# Patient Record
Sex: Female | Born: 1990 | Race: Asian | Hispanic: No | Marital: Married | Smoking: Never smoker
Health system: Southern US, Community
[De-identification: ages and names within clinical notes are randomized; demographics above are authoritative.]

## PROBLEM LIST (undated history)

## (undated) ENCOUNTER — Inpatient Hospital Stay (HOSPITAL_COMMUNITY): Payer: Self-pay

## (undated) DIAGNOSIS — O42919 Preterm premature rupture of membranes, unspecified as to length of time between rupture and onset of labor, unspecified trimester: Secondary | ICD-10-CM

## (undated) DIAGNOSIS — B999 Unspecified infectious disease: Secondary | ICD-10-CM

## (undated) DIAGNOSIS — D649 Anemia, unspecified: Secondary | ICD-10-CM

## (undated) HISTORY — DX: Preterm premature rupture of membranes, unspecified as to length of time between rupture and onset of labor, unspecified trimester: O42.919

## (undated) HISTORY — PX: NO PAST SURGERIES: SHX2092

---

## 2017-05-04 NOTE — L&D Delivery Note (Addendum)
Obstetrical Delivery Note   Date of Delivery:   08/31/2017 Primary OB:   [facility] Gestational Age/EDD: [redacted]w[redacted]d Reason for Admission: PPROM at 16/3, advanced cervical dilation Antepartum complications: Chronic vaginal bleeding  Delivered By:   Cornelia Copa. MD  Delivery Type:   Spontaneous vaginal delivery Delivery Details:   Patient with sudden onsent pain and pressure after cytotec vaginally placed approximately 4 hours prior. patient easily delivered fetus. Patient had temp to 100.3 right after delivery. Will give tylenol and watch clinically since nidus for infection has been delivered. A dose of methergine IM was given post delivery for PPH prophylaxis.  Anesthesia:    none Intrapartum complications: None GBS:    unknown Laceration:    none Episiotomy:    none Rectal exam:   deferred Placenta:    Delivered easily. Intact: yes. To pathology: yes.  3 vessel cord seen.  Estimated Blood Loss:  50mL  Baby:    Stillborn female, APGARs 0/0, weight 116gm. Appears morphologically normal.   Cornelia Copa MD Attending Center for [facility])

## 2017-06-08 ENCOUNTER — Encounter (HOSPITAL_COMMUNITY): Payer: Self-pay | Admitting: Advanced Practice Midwife

## 2017-06-08 ENCOUNTER — Inpatient Hospital Stay (HOSPITAL_COMMUNITY): Payer: Self-pay

## 2017-06-08 ENCOUNTER — Other Ambulatory Visit: Payer: Self-pay

## 2017-06-08 ENCOUNTER — Inpatient Hospital Stay (HOSPITAL_COMMUNITY)
Admission: AD | Admit: 2017-06-08 | Discharge: 2017-06-08 | Disposition: A | Discharge status: Home / Self Care | Payer: Self-pay | Source: Ambulatory Visit | Attending: Obstetrics and Gynecology | Admitting: Obstetrics and Gynecology

## 2017-06-08 DIAGNOSIS — O208 Other hemorrhage in early pregnancy: Secondary | ICD-10-CM

## 2017-06-08 DIAGNOSIS — O209 Hemorrhage in early pregnancy, unspecified: Secondary | ICD-10-CM | POA: Insufficient documentation

## 2017-06-08 DIAGNOSIS — Z3A01 Less than 8 weeks gestation of pregnancy: Secondary | ICD-10-CM | POA: Insufficient documentation

## 2017-06-08 DIAGNOSIS — O3680X0 Pregnancy with inconclusive fetal viability, not applicable or unspecified: Secondary | ICD-10-CM

## 2017-06-08 LAB — WET PREP, GENITAL
Clue Cells Wet Prep HPF POC: NONE SEEN
Sperm: NONE SEEN
TRICH WET PREP: NONE SEEN
YEAST WET PREP: NONE SEEN

## 2017-06-08 LAB — POCT PREGNANCY, URINE: PREG TEST UR: POSITIVE — AB

## 2017-06-08 LAB — URINALYSIS, ROUTINE W REFLEX MICROSCOPIC
BILIRUBIN URINE: NEGATIVE
Glucose, UA: NEGATIVE mg/dL
KETONES UR: NEGATIVE mg/dL
LEUKOCYTES UA: NEGATIVE
NITRITE: NEGATIVE
PROTEIN: NEGATIVE mg/dL
Specific Gravity, Urine: 1.004 — ABNORMAL LOW (ref 1.005–1.030)
pH: 6 (ref 5.0–8.0)

## 2017-06-08 LAB — CBC
HCT: 35.6 % — ABNORMAL LOW (ref 36.0–46.0)
Hemoglobin: 12 g/dL (ref 12.0–15.0)
MCH: 25.1 pg — ABNORMAL LOW (ref 26.0–34.0)
MCHC: 33.7 g/dL (ref 30.0–36.0)
MCV: 74.5 fL — ABNORMAL LOW (ref 78.0–100.0)
Platelets: 258 10*3/uL (ref 150–400)
RBC: 4.78 MIL/uL (ref 3.87–5.11)
RDW: 14 % (ref 11.5–15.5)
WBC: 9.3 10*3/uL (ref 4.0–10.5)

## 2017-06-08 LAB — HCG, QUANTITATIVE, PREGNANCY: HCG, BETA CHAIN, QUANT, S: 2416 m[IU]/mL — AB (ref <5)

## 2017-06-08 LAB — ABO/RH: ABO/RH(D): B POS

## 2017-06-08 MED ORDER — CONCEPT OB 130-92.4-1 MG PO CAPS: 1.0000 | ORAL_CAPSULE | Freq: Every day | ORAL | 12 refills | Status: AC

## 2017-06-08 NOTE — Discharge Instructions (Signed)
Vaginal Bleeding During Pregnancy, First Trimester °A small amount of bleeding (spotting) from the vagina is common in early pregnancy. Sometimes the bleeding is normal and is not a problem, and sometimes it is a sign of something serious. Be sure to tell your doctor about any bleeding from your vagina right away. °Follow these instructions at home: °· Watch your condition for any changes. °· Follow your doctor's instructions about how active you can be. °· If you are on bed rest: °? You may need to stay in bed and only get up to use the bathroom. °? You may be allowed to do some activities. °? If you need help, make plans for someone to help you. °· Write down: °? The number of pads you use each day. °? How often you change pads. °? How soaked (saturated) your pads are. °· Do not use tampons. °· Do not douche. °· Do not have sex or orgasms until your doctor says it is okay. °· If you pass any tissue from your vagina, save the tissue so you can show it to your doctor. °· Only take medicines as told by your doctor. °· Do not take aspirin because it can make you bleed. °· Keep all follow-up visits as told by your doctor. °Contact a doctor if: °· You bleed from your vagina. °· You have cramps. °· You have labor pains. °· You have a fever that does not go away after you take medicine. °Get help right away if: °· You have very bad cramps in your back or belly (abdomen). °· You pass large clots or tissue from your vagina. °· You bleed more. °· You feel light-headed or weak. °· You pass out (faint). °· You have chills. °· You are leaking fluid or have a gush of fluid from your vagina. °· You pass out while pooping (having a bowel movement). °This information is not intended to replace advice given to you by your health care provider. Make sure you discuss any questions you have with your health care provider. °Document Released: 09/04/2013 Document Revised: 09/26/2015 Document Reviewed: 12/26/2012 °Elsevier Interactive  Patient Education © 2018 Elsevier Inc. ° °

## 2017-06-08 NOTE — MAU Note (Cosign Needed Addendum)
History  Chief Complaint  Patient presents with  . Vaginal Bleeding  . Abdominal Pain  . Possible Pregnancy   HPI  Rita Acosta is a 27 y.o. G44P0000 female with non-contriubatory PMHx who presents with her husband (she wants him to help translate for her) to the MAU with a chief complaint of vaginal spotting and a positive home pregnancy test. Patient states that she has had longer intermenstrual time since moving here a few months ago ranging anywhere from 35 to 40 days between periods. She states that her and her husband had been trying to get pregnant recently, so when she felt "off" they decided to take a pregnancy test which came back positive. She wanted to be evaluated here today to have confirmation of this pregnancy and to be evaluated for the spotting. States that her LMP was 04/29/17 and was normal in duration, amount of blood and felt subjectively normal. Given this LMP date, estimated to be [redacted]w[redacted]d gestation.  Otherwise she denies any dizziness, syncope, headache, fevers, chills, palpitations, shortness of breath, cough, NVD, constipation. Endorses some lower crampy abdominal pain that is intermittent and 2-3/10 severity over the past week. Endorses dependent lower extremity edema only when "wearing certain shoes." Endorses a small amount (<teaspoon) of blood from her vagina over the past 2 days. Patient also notes some thin clear/white discharge that is "normal after all of my periods."   Patient has never had a pap smear. She does state that she has had a bimanual exam in the past.   History reviewed. No pertinent past medical history.  History reviewed. No pertinent surgical history.  No family history on file.  Social History   Tobacco Use  . Smoking status: Never Smoker  . Smokeless tobacco: Never Used  Substance Use Topics  . Alcohol use: No    Frequency: Never  . Drug use: No    Allergies: Allergies not on file  No medications prior to admission.    Physical  Exam Blood pressure 103/79, pulse 88, temperature 99.2 F (37.3 C), temperature source Oral, resp. rate 16, weight 53.3 kg (117 lb 8 oz), last menstrual period 04/29/2017, SpO2 100 %. Physical Exam  Constitutional: She is oriented to person, place, and time. She appears well-developed and well-nourished. No distress.  Eyes: Conjunctivae and EOM are normal. Pupils are equal, round, and reactive to light. Right eye exhibits no discharge. Left eye exhibits no discharge. No scleral icterus.  Cardiovascular: Normal rate, regular rhythm and intact distal pulses.  Murmur (2/6 systolic murmur appreciated) heard. Respiratory: Effort normal and breath sounds normal. No stridor. No respiratory distress. She has no wheezes. She has no rales. She exhibits no tenderness.  GI: Soft. Bowel sounds are normal. She exhibits no distension and no mass. There is no tenderness. There is no rebound and no guarding.  Genitourinary: Vagina normal and uterus normal. No vaginal discharge found.  Genitourinary Comments: Small amount of blood coming from the cervical os. No lesions. No ectropion.   Musculoskeletal: She exhibits no edema or deformity.  Neurological: She is alert and oriented to person, place, and time.  Skin: Skin is warm and dry. No rash noted. She is not diaphoretic. No erythema. No pallor.  Psychiatric: She has a normal mood and affect. Her behavior is normal. Judgment and thought content normal.     MDM Patient hCG >2400. TVUS revealed "A small rounded fluid collection in the endometrium is likely an early gestational sac. No yolk sac or fetal pole is seen  at this time however. A small amount of fluid in the endometrial canal may represent a small amount of blood given history." Cannot rule out miscarriage or extrauterine pregnancy.     Assessment/Plan  1. First Trimester Bleeding -patient to follow up in clinic on Friday for repeat hCG. Need to schedule TVUS one week after labs on Friday to  re-evaluate uterine contents.  -recommend no intercourse until labs on Friday in order to not cause undue scare if blood.  -patient to seek medical attention if severe abdominal pain, heavy bleeding, fevers, syncope, low blood pressures.   CNM attestation:  I have seen and examined this patient and agree with above documentation in the PA student's note.   Malena CatholicKhadiza Acosta is a 27 y.o. G1P0 female reporting pos home UPT and light vaginal bleeding.   Associated symptoms:  Neg fever and chills Neg abdominal pain Pos vaginal bleeding Neg vaginal discharge Neg urinary complaints Neg GI complaints  PE: No data found. Gen: calm comfortable, NAD Resp: normal effort, no distress Heart: Regular rate Abd: Soft, NT, Pos BS x 4 Neuro: A&O x 4 Pelvic exam: NEFG, Physiologic thin, white, odorless discharge, scant brown vaginal bleeding. Uterus top-nml size, no adnexal masses or tenderness. no cervical motion tenderness. Exam performed by CNM.  ROS, labs, PMH reviewed  Orders Placed This Encounter  Procedures  . Wet prep, genital  . US OB Transvaginal  . US OB Comp Less 14 Wks  . Urinalysis, Routine w reflex microscopic  . hCG, quantitative, pregnancy  . CBC  . Pregnancy, urine POC  . ABO/Rh  . Discharge patient   Meds ordered this encounter  Medications  . Prenat w/o A Vit-FeFum-FePo-FA (CONCEPT OB) 130-92.4-1 MG CAPS    Sig: Take 1 tablet by mouth daily.    Dispense:  30 capsule    Refill:  12    Order Specific Question:   Supervising Provider    Answer:   Levie HeritageSTINSON, JACOB J [[id number]]    MDM PUL. Hemodynamically stable.   Assessment 1. Pregnancy of unknown anatomic location   2. Vaginal bleeding affecting early pregnancy     Plan: - Discharge home in stable condition. - Ectopic precautions. - [facility]. - Return to maternity admissions symptoms worsen  Katrinka BlazingSmith, [place], [place] 06/27/2017 1:53 PM

## 2017-06-08 NOTE — MAU Note (Signed)
+  HPT this morning. Started spotting 2 days ago, reddish initially now red.  Cramping in lower abd. Headache off and on for past wk.

## 2017-06-09 LAB — GC/CHLAMYDIA PROBE AMP (~~LOC~~) NOT AT ARMC
Chlamydia: NEGATIVE
Neisseria Gonorrhea: NEGATIVE

## 2017-06-10 LAB — HIV ANTIBODY (ROUTINE TESTING W REFLEX): HIV Screen 4th Generation wRfx: NONREACTIVE

## 2017-06-11 ENCOUNTER — Ambulatory Visit: Payer: Self-pay | Admitting: *Deleted

## 2017-06-11 ENCOUNTER — Encounter: Payer: Self-pay | Admitting: *Deleted

## 2017-06-11 DIAGNOSIS — O200 Threatened abortion: Secondary | ICD-10-CM

## 2017-06-11 LAB — HCG, QUANTITATIVE, PREGNANCY: hCG, Beta Chain, Quant, S: 6960 m[IU]/mL — ABNORMAL HIGH (ref <5)

## 2017-06-11 NOTE — Progress Notes (Signed)
Patient here for STAT beta hCG.  Last seen in MAU on 06/08/17.  Beta hCG on 06/08/17 = 2416.  Ultrasound in MAU on 06/08/17 -  IMPRESSION: 1. A small rounded fluid collection in the endometrium is likely an early gestational sac. No yolk sac or fetal pole is seen at this time however. Recommend close clinical and imaging follow-up. 2. A small amount of fluid in the endometrial canal may represent a small amount of blood given history.  Patient speaks some [place].  No problem with discussing what we would be doing today.  Today patient states she has had no additional bleeding since her MAU visit on 06/08/17.  States she is experiencing some mild period type cramping.  Blood draw to be completed and run STAT.  Patient and significant other to wait in lobby for results.  Patient states understanding.  Beta hCG results final = 6,960.  Spoke via phone with Dr. Vergie LivingPickens covering house.  Read U/S results from 06/08/17 MAU visit.  Explained patient came into MAU for some bleeding and positive pregnancy test.  Patient has no significant medical history per MAU notes.  Dr. Vergie LivingPickens ordered repeat U/S in 12-14 days.  Ultrasound scheduled for 06/22/17 at 1245 pm.  Explained to patient and husband that after the U/S they will come back to our office for results.  Encouraged to continue her prenatal vitamins and folic acid.  Encouraged a healthy diet.  Husband asked for letter with estimated due date for [facility].  Letter given.  I encouraged them if they desire to have prenatal care here to tell [facility] staff and they will schedule prenatal appointment with our office for the patient.  I did explain we really won't know for sure that we have a good pregnancy until the U/S on 06/22/17 is completed.  Both patient and husband state understanding.  No other questions expressed.  AVS printed and given to patient.

## 2017-06-13 ENCOUNTER — Inpatient Hospital Stay (HOSPITAL_COMMUNITY): Payer: Self-pay

## 2017-06-13 ENCOUNTER — Inpatient Hospital Stay (HOSPITAL_COMMUNITY)
Admission: AD | Admit: 2017-06-13 | Discharge: 2017-06-13 | Disposition: A | Discharge status: Home / Self Care | Payer: Self-pay | Source: Ambulatory Visit | Attending: Obstetrics & Gynecology | Admitting: Obstetrics & Gynecology

## 2017-06-13 ENCOUNTER — Encounter (HOSPITAL_COMMUNITY): Payer: Self-pay

## 2017-06-13 DIAGNOSIS — O209 Hemorrhage in early pregnancy, unspecified: Secondary | ICD-10-CM | POA: Insufficient documentation

## 2017-06-13 DIAGNOSIS — Z3A01 Less than 8 weeks gestation of pregnancy: Secondary | ICD-10-CM | POA: Insufficient documentation

## 2017-06-13 DIAGNOSIS — Z6730 Type AB blood, Rh positive: Secondary | ICD-10-CM | POA: Insufficient documentation

## 2017-06-13 DIAGNOSIS — O4691 Antepartum hemorrhage, unspecified, first trimester: Secondary | ICD-10-CM

## 2017-06-13 DIAGNOSIS — Z6790 Unspecified blood type, Rh positive: Secondary | ICD-10-CM

## 2017-06-13 DIAGNOSIS — O4690 Antepartum hemorrhage, unspecified, unspecified trimester: Secondary | ICD-10-CM

## 2017-06-13 HISTORY — DX: Unspecified infectious disease: B99.9

## 2017-06-13 LAB — URINALYSIS, ROUTINE W REFLEX MICROSCOPIC
BILIRUBIN URINE: NEGATIVE
Glucose, UA: NEGATIVE mg/dL
Ketones, ur: NEGATIVE mg/dL
Leukocytes, UA: NEGATIVE
NITRITE: NEGATIVE
PROTEIN: NEGATIVE mg/dL
Specific Gravity, Urine: 1.006 (ref 1.005–1.030)
pH: 6 (ref 5.0–8.0)

## 2017-06-13 LAB — CBC
HCT: 36.2 % (ref 36.0–46.0)
Hemoglobin: 12.3 g/dL (ref 12.0–15.0)
MCH: 25.2 pg — ABNORMAL LOW (ref 26.0–34.0)
MCHC: 34 g/dL (ref 30.0–36.0)
MCV: 74.2 fL — ABNORMAL LOW (ref 78.0–100.0)
PLATELETS: 256 10*3/uL (ref 150–400)
RBC: 4.88 MIL/uL (ref 3.87–5.11)
RDW: 14.3 % (ref 11.5–15.5)
WBC: 9 10*3/uL (ref 4.0–10.5)

## 2017-06-13 LAB — HCG, QUANTITATIVE, PREGNANCY: HCG, BETA CHAIN, QUANT, S: 12857 m[IU]/mL — AB (ref <5)

## 2017-06-13 NOTE — MAU Note (Signed)
Pt c/o bright red vaginal bleeding that started Tuesday. Pt states it has continued and is now brownish discharge. Pt denies pain. Pt only using tissue for bleeding.

## 2017-06-13 NOTE — MAU Provider Note (Signed)
History     CSN: [id number]  Arrival date and time: 06/13/17 4098   First Provider Initiated Contact with Patient 06/13/17 2040      Chief Complaint  Patient presents with  . Vaginal Bleeding   27 y.o. G1 @[redacted]w[redacted]d  here with VB. Bleeding started 5 days ago. Today it was bright red when she used the BR, then became scant and brown through the day. It was not enough to need a pad. Also reports mild low abdominal cramping. She was seen in MAU 5 days ago for the bleeding and quant HCG was 2416, US showed small fluid collection but no YS or FP.     OB History    Gravida Para Term Preterm AB Living   1             SAB TAB Ectopic Multiple Live Births                  Past Medical History:  Diagnosis Date  . Infection    pneumonia as infant and malaria infection at 1 years old    Past Surgical History:  Procedure Laterality Date  . NO PAST SURGERIES      Family History  Problem Relation Age of Onset  . Hypertension Mother   . Diabetes Father     Social History   Tobacco Use  . Smoking status: Never Smoker  . Smokeless tobacco: Never Used  Substance Use Topics  . Alcohol use: No    Frequency: Never  . Drug use: No    Allergies: No Known Allergies  No medications prior to admission.    Review of Systems  Gastrointestinal: Positive for abdominal pain.  Genitourinary: Positive for vaginal bleeding.   Physical Exam   Blood pressure (!) 103/55, pulse 77, temperature 98.1 F (36.7 C), temperature source Oral, resp. rate 16, height 5' (1.524 m), weight 120 lb (54.4 kg), last menstrual period 04/29/2017, SpO2 100 %.  Physical Exam  Constitutional: She is oriented to person, place, and time. She appears well-developed and well-nourished. No distress.  HENT:  Head: Normocephalic.  Neck: Normal range of motion.  Cardiovascular: Normal rate.  Respiratory: Effort normal. No respiratory distress.  Genitourinary:  Genitourinary Comments: External: no lesions or  erythema Vagina: rugated, pink, moist, scant drk bloody discharge, cleared with 1 fox swab   Musculoskeletal: Normal range of motion.  Neurological: She is alert and oriented to person, place, and time.  Skin: Skin is warm and dry.  Psychiatric: She has a normal mood and affect.   Results for orders placed or performed during the hospital encounter of 06/13/17 (from the past 24 hour(s))  Urinalysis, Routine w reflex microscopic     Status: Abnormal   Collection Time: 06/13/17  8:08 PM  Result Value Ref Range   Color, Urine STRAW (A) YELLOW   APPearance CLEAR CLEAR   Specific Gravity, Urine 1.006 1.005 - 1.030   pH 6.0 5.0 - 8.0   Glucose, UA NEGATIVE NEGATIVE mg/dL   Hgb urine dipstick MODERATE (A) NEGATIVE   Bilirubin Urine NEGATIVE NEGATIVE   Ketones, ur NEGATIVE NEGATIVE mg/dL   Protein, ur NEGATIVE NEGATIVE mg/dL   Nitrite NEGATIVE NEGATIVE   Leukocytes, UA NEGATIVE NEGATIVE   RBC / HPF 0-5 0 - 5 RBC/hpf   WBC, UA 0-5 0 - 5 WBC/hpf   Bacteria, UA RARE (A) NONE SEEN   Squamous Epithelial / LPF 0-5 (A) NONE SEEN  CBC     Status: Abnormal  Collection Time: 06/13/17  9:02 PM  Result Value Ref Range   WBC 9.0 4.0 - 10.5 K/uL   RBC 4.88 3.87 - 5.11 MIL/uL   Hemoglobin 12.3 12.0 - 15.0 g/dL   HCT 16.136.2 09.636.0 - 04.546.0 %   MCV 74.2 (L) 78.0 - 100.0 fL   MCH 25.2 (L) 26.0 - 34.0 pg   MCHC 34.0 30.0 - 36.0 g/dL   RDW 40.914.3 81.111.5 - 91.415.5 %   Platelets 256 150 - 400 K/uL  hCG, quantitative, pregnancy     Status: Abnormal   Collection Time: 06/13/17  9:02 PM  Result Value Ref Range   hCG, Beta Chain, Quant, S 12,857 (H) <5 mIU/mL   [place] Ob Transvaginal  Result Date: 06/13/2017 CLINICAL DATA:  Acute onset of vaginal bleeding. EXAM: TRANSVAGINAL OB ULTRASOUND TECHNIQUE: Transvaginal ultrasound was performed for complete evaluation of the gestation as well as the maternal uterus, adnexal regions, and pelvic cul-de-sac. COMPARISON:  Pelvic ultrasound performed 06/08/2017 FINDINGS:  Intrauterine gestational sac: Single; visualized and normal in shape. Yolk sac:  Yes Embryo:  No MSD: 10.2 mm   5 w   5  d Subchorionic hemorrhage: None seen. A small amount of clot is noted at the lower uterine segment. Maternal uterus/adnexae: The ovaries are unremarkable in appearance. The right ovary measures 3.2 x 2.7 x 2.1 cm, while the left ovary measures 2.5 x 1.2 x 1.8 cm. There is no evidence for ovarian torsion. No suspicious adnexal masses are seen. Trace free fluid is seen within the pelvic cul-de-sac. IMPRESSION: 1. Single intrauterine gestational sac noted, with a mean sac diameter of 1.0 cm, corresponding to a gestational age of [redacted] weeks 5 days. This matches the gestational age of [redacted] weeks 3 days by LMP, reflecting an estimated date of delivery of February 03, 2018. No embryo is yet seen; a yolk sac is visualized. 2. Small amount of clot noted at the lower uterine segment, likely corresponding to the patient's vaginal bleeding. Electronically Signed   By: Roanna RaiderJeffery  Chang M.D.   On: 06/13/2017 21:42   MAU Course  Procedures  MDM Labs and US ordered and reviewed. Good rise in quant HCG. US shows IUGS and YS therefore ectopic ruled out. Recommend viability US as already scheduled. Discussed findings with pt and spouse. Stable for discharge home.  Assessment and Plan   1. [redacted] weeks gestation of pregnancy   2. Vaginal bleeding in pregnancy   3. Blood type, Rh positive    Discharge home SAB precautions Follow up in 9 days for US  Allergies as of 06/13/2017   No Known Allergies     Medication List    TAKE these medications   CONCEPT OB 130-92.4-1 MG Caps Take 1 tablet by mouth daily.   FOLIC ACID PO Take 1 tablet by mouth daily.      Donette LarryMelanie Burnadette Baskett, CNM 06/13/2017, 10:45 PM

## 2017-06-13 NOTE — Progress Notes (Addendum)
G1 @ 6.[redacted] wksga. Here for bleeding since Tuesday. Was here Friday at the clinic for repeat HCG level and had gone up from 2K to 5 k.  Pt cont to bleed small amounts when using the bathroom "teaspoon size".   2041: Provider at bs assessing. Speculum exam done to look at bleeding. Small amount of dark color bleeding noted.   2055: U/S paged and returned paged. Report status given. Ok to bring pt. Informed pt needs lab first and would bring her once completed.   2212: provider at bs reassessing and discussing results of u/s and tests.  2215:  d/c instruction given with pt understanding. Pt left unit via ambulatory with SO

## 2017-06-13 NOTE — Discharge Instructions (Signed)
Vaginal Bleeding During Pregnancy, First Trimester °A small amount of bleeding (spotting) from the vagina is common in early pregnancy. Sometimes the bleeding is normal and is not a problem, and sometimes it is a sign of something serious. Be sure to tell your doctor about any bleeding from your vagina right away. °Follow these instructions at home: °· Watch your condition for any changes. °· Follow your doctor's instructions about how active you can be. °· If you are on bed rest: °? You may need to stay in bed and only get up to use the bathroom. °? You may be allowed to do some activities. °? If you need help, make plans for someone to help you. °· Write down: °? The number of pads you use each day. °? How often you change pads. °? How soaked (saturated) your pads are. °· Do not use tampons. °· Do not douche. °· Do not have sex or orgasms until your doctor says it is okay. °· If you pass any tissue from your vagina, save the tissue so you can show it to your doctor. °· Only take medicines as told by your doctor. °· Do not take aspirin because it can make you bleed. °· Keep all follow-up visits as told by your doctor. °Contact a doctor if: °· You bleed from your vagina. °· You have cramps. °· You have labor pains. °· You have a fever that does not go away after you take medicine. °Get help right away if: °· You have very bad cramps in your back or belly (abdomen). °· You pass large clots or tissue from your vagina. °· You bleed more. °· You feel light-headed or weak. °· You pass out (faint). °· You have chills. °· You are leaking fluid or have a gush of fluid from your vagina. °· You pass out while pooping (having a bowel movement). °This information is not intended to replace advice given to you by your health care provider. Make sure you discuss any questions you have with your health care provider. °Document Released: 09/04/2013 Document Revised: 09/26/2015 Document Reviewed: 12/26/2012 °Elsevier Interactive  Patient Education © 2018 Elsevier Inc. ° °

## 2017-06-22 ENCOUNTER — Other Ambulatory Visit (INDEPENDENT_AMBULATORY_CARE_PROVIDER_SITE_OTHER): Payer: Self-pay

## 2017-06-22 ENCOUNTER — Encounter: Payer: Self-pay | Admitting: Student in an Organized Health Care Education/Training Program

## 2017-06-22 ENCOUNTER — Ambulatory Visit (HOSPITAL_COMMUNITY): Payer: Self-pay

## 2017-06-22 DIAGNOSIS — Z3A01 Less than 8 weeks gestation of pregnancy: Secondary | ICD-10-CM

## 2017-06-22 DIAGNOSIS — Z3491 Encounter for supervision of normal pregnancy, unspecified, first trimester: Secondary | ICD-10-CM

## 2017-06-22 LAB — POCT URINALYSIS DIP (MANUAL ENTRY)
BILIRUBIN UA: NEGATIVE
BILIRUBIN UA: NEGATIVE mg/dL
GLUCOSE UA: NEGATIVE mg/dL
Leukocytes, UA: NEGATIVE
Nitrite, UA: NEGATIVE
PH UA: 6 (ref 5.0–8.0)
Protein Ur, POC: NEGATIVE mg/dL
Urobilinogen, UA: 0.2 E.U./dL

## 2017-06-22 LAB — POCT UA - MICROSCOPIC ONLY

## 2017-06-23 LAB — OBSTETRIC PANEL, INCLUDING HIV
Antibody Screen: NEGATIVE
BASOS ABS: 0 10*3/uL (ref 0.0–0.2)
Basos: 0 %
EOS (ABSOLUTE): 0.1 10*3/uL (ref 0.0–0.4)
Eos: 1 %
HEP B S AG: NEGATIVE
HIV Screen 4th Generation wRfx: NONREACTIVE
Hematocrit: 37 % (ref 34.0–46.6)
Hemoglobin: 12.2 g/dL (ref 11.1–15.9)
IMMATURE GRANULOCYTES: 0 %
Immature Grans (Abs): 0 10*3/uL (ref 0.0–0.1)
Lymphocytes Absolute: 2.5 10*3/uL (ref 0.7–3.1)
Lymphs: 24 %
MCH: 25.2 pg — AB (ref 26.6–33.0)
MCHC: 33 g/dL (ref 31.5–35.7)
MCV: 76 fL — ABNORMAL LOW (ref 79–97)
Monocytes Absolute: 0.6 10*3/uL (ref 0.1–0.9)
Monocytes: 6 %
NEUTROS PCT: 69 %
Neutrophils Absolute: 7.2 10*3/uL — ABNORMAL HIGH (ref 1.4–7.0)
PLATELETS: 303 10*3/uL (ref 150–379)
RBC: 4.84 x10E6/uL (ref 3.77–5.28)
RDW: 14.8 % (ref 12.3–15.4)
RPR: NONREACTIVE
Rh Factor: POSITIVE
Rubella Antibodies, IGG: 4.43 index (ref >0.99)
WBC: 10.3 10*3/uL (ref 3.4–10.8)

## 2017-06-24 ENCOUNTER — Encounter: Payer: Self-pay | Admitting: Student in an Organized Health Care Education/Training Program

## 2017-06-24 LAB — URINE CULTURE, OB REFLEX

## 2017-06-24 LAB — CULTURE, OB URINE

## 2017-06-28 ENCOUNTER — Ambulatory Visit (HOSPITAL_COMMUNITY)
Admission: RE | Admit: 2017-06-28 | Discharge: 2017-06-28 | Disposition: A | Discharge status: Home / Self Care | Payer: Self-pay | Source: Ambulatory Visit | Attending: Family Medicine | Admitting: Family Medicine

## 2017-06-28 DIAGNOSIS — O3680X0 Pregnancy with inconclusive fetal viability, not applicable or unspecified: Secondary | ICD-10-CM | POA: Insufficient documentation

## 2017-06-28 DIAGNOSIS — Z3A01 Less than 8 weeks gestation of pregnancy: Secondary | ICD-10-CM | POA: Insufficient documentation

## 2017-06-29 ENCOUNTER — Ambulatory Visit (INDEPENDENT_AMBULATORY_CARE_PROVIDER_SITE_OTHER): Payer: Self-pay | Admitting: Student in an Organized Health Care Education/Training Program

## 2017-06-29 ENCOUNTER — Encounter: Payer: Self-pay | Admitting: Student in an Organized Health Care Education/Training Program

## 2017-06-29 ENCOUNTER — Other Ambulatory Visit: Payer: Self-pay

## 2017-06-29 VITALS — BP 98/64 | HR 86 | Temp 98.4°F | Wt 117.8 lb

## 2017-06-29 DIAGNOSIS — O219 Vomiting of pregnancy, unspecified: Secondary | ICD-10-CM

## 2017-06-29 DIAGNOSIS — Z3A08 8 weeks gestation of pregnancy: Secondary | ICD-10-CM

## 2017-06-29 MED ORDER — VITAMIN B-6 25 MG PO TABS: 25.0000 mg | ORAL_TABLET | Freq: Every day | ORAL | 0 refills | Status: DC

## 2017-06-29 NOTE — Progress Notes (Signed)
Rita Acosta is a 27 y.o. yo G1P0 at 8857w0d who presents for her initial prenatal visit. Pregnancy is planned She reports fatigue and morning sickness. She  is taking PNV. See flow sheet for details.  PMH, POBH, FH, meds, allergies and Social Hx reviewed.  Prenatal Exam: Gen: Well nourished, well developed.  No distress.  Vitals noted. HEENT: Normocephalic, atraumatic.  Neck supple without cervical lymphadenopathy, thyromegaly or thyroid nodules.  Fair dentition. CV: RRR no murmur, gallops or rubs Lungs: CTAB.  Normal respiratory effort without wheezes or rales. Abd: soft, NTND. +BS.  Uterus not appreciated above pelvis. GU: Normal external female genitalia without lesions.  Normal vaginal, well rugated without lesions. No vaginal discharge.  Bimanual exam: No adnexal mass or TTP. No CMT.  Uterus size below umbilicus Ext: No clubbing, cyanosis or edema. Psych: Normal grooming and dress.  Not depressed or anxious appearing.  Normal thought content and process without flight of ideas or looseness of associations.  Assessment & Plan: 1) 27 y.o. yo G1P0 at 4557w0d via early ultrasound, doing well.  Current pregnancy issues include:  1. Patient had early bleeding and was seen in the MAU. IUP confirmed on ultrasound. No bleeding since her ultrasound.   2. Nausea without vomiting: reviewed small snacks and hydration today. Patient would like rx for pyridoxine which was sent to her pharmacy.  Dating is reliable. Prenatal labs reviewed, normal. Genetic screening offered: Patient will think about it but she is leaning towards genetic testing. Early glucola is not ndicated.  PHQ-9 and Pregnancy Medical Home forms completed and reviewed.  Bleeding and pain precautions reviewed. Importance of prenatal vitamins reviewed.  Follow up in 4 weeks.

## 2017-06-29 NOTE — Patient Instructions (Signed)
It was a pleasure seeing you today in our clinic. Today we discussed your pregnancy.   - I prescribed Isoniazid (vitamin B6) to use for nausea. - Continue to take a prenatal vitamin. This is also available over the counter, prenatal vitamin + DHEA. - You can use any lotion, such as cetaphil, for your dry skin.  Please schedule follow up in 4-6 weeks.  Our clinic's number is [phone]. Please call with questions or concerns about what we discussed today.  Be well, Dr. Mosetta PuttFeng

## 2017-06-30 LAB — CERVICOVAGINAL ANCILLARY ONLY
CHLAMYDIA, DNA PROBE: NEGATIVE
Neisseria Gonorrhea: NEGATIVE

## 2017-07-01 LAB — SICKLE CELL SCREEN: Sickle Cell Screen: NEGATIVE

## 2017-07-01 LAB — CYTOLOGY - PAP: DIAGNOSIS: NEGATIVE

## 2017-07-05 ENCOUNTER — Encounter: Payer: Self-pay | Admitting: Student in an Organized Health Care Education/Training Program

## 2017-07-16 ENCOUNTER — Inpatient Hospital Stay (HOSPITAL_COMMUNITY)
Admission: AD | Admit: 2017-07-16 | Discharge: 2017-07-16 | Disposition: A | Discharge status: Home / Self Care | Payer: Self-pay | Source: Ambulatory Visit | Attending: Obstetrics & Gynecology | Admitting: Obstetrics & Gynecology

## 2017-07-16 ENCOUNTER — Encounter: Payer: Self-pay | Admitting: Student in an Organized Health Care Education/Training Program

## 2017-07-16 ENCOUNTER — Encounter (HOSPITAL_COMMUNITY): Payer: Self-pay

## 2017-07-16 DIAGNOSIS — O200 Threatened abortion: Secondary | ICD-10-CM | POA: Insufficient documentation

## 2017-07-16 DIAGNOSIS — Z3A09 9 weeks gestation of pregnancy: Secondary | ICD-10-CM | POA: Insufficient documentation

## 2017-07-16 DIAGNOSIS — Z3491 Encounter for supervision of normal pregnancy, unspecified, first trimester: Secondary | ICD-10-CM

## 2017-07-16 NOTE — Telephone Encounter (Signed)
Called patient again and LVM. Unable to give specifics over voicemail. Based on [employer] image, appears patient may be having a miscarriage. I would prefer to speak with her on the phone however I have been unable to get through.   I advised her to call back our clinic and indicated on the voice mail that may be appropriate for her to be evaluated in the MAU.  Additionally in the future we will have to let the patient know that mychart is not a good method of communication for potential medical emergencies.

## 2017-07-16 NOTE — Discharge Instructions (Signed)
Threatened Miscarriage °A threatened miscarriage occurs when you have vaginal bleeding during your first 20 weeks of pregnancy but the pregnancy has not ended. If you have vaginal bleeding during this time, your health care provider will do tests to make sure you are still pregnant. If the tests show you are still pregnant and the developing baby (fetus) inside your womb (uterus) is still growing, your condition is considered a threatened miscarriage. °A threatened miscarriage does not mean your pregnancy will end, but it does increase the risk of losing your pregnancy (complete miscarriage). °What are the causes? °The cause of a threatened miscarriage is usually not known. If you go on to have a complete miscarriage, the most common cause is an abnormal number of chromosomes in the developing baby. Chromosomes are the structures inside cells that hold all your genetic material. °Some causes of vaginal bleeding that do not result in miscarriage include: °· Having sex. °· Having an infection. °· Normal hormone changes of pregnancy. °· Bleeding that occurs when an egg implants in your uterus. ° °What increases the risk? °Risk factors for bleeding in early pregnancy include: °· Obesity. °· Smoking. °· Drinking excessive amounts of alcohol or caffeine. °· Recreational drug use. ° °What are the signs or symptoms? °· Light vaginal bleeding. °· Mild abdominal pain or cramps. °How is this diagnosed? °If you have bleeding with or without abdominal pain before 20 weeks of pregnancy, your health care provider will do tests to check whether you are still pregnant. One important test involves using sound waves and a computer (ultrasound) to create images of the inside of your uterus. Other tests include an internal exam of your vagina and uterus (pelvic exam) and measurement of your baby’s heart rate. °You may be diagnosed with a threatened miscarriage if: °· Ultrasound testing shows you are still pregnant. °· Your baby’s heart  rate is strong. °· A pelvic exam shows that the opening between your uterus and your vagina (cervix) is closed. °· Your heart rate and blood pressure are stable. °· Blood tests confirm you are still pregnant. ° °How is this treated? °No treatments have been shown to prevent a threatened miscarriage from going on to a complete miscarriage. However, the right home care is important. °Follow these instructions at home: °· Make sure you keep all your appointments for prenatal care. This is very important. °· Get plenty of rest. °· Do not have sex or use tampons if you have vaginal bleeding. °· Do not douche. °· Do not smoke or use recreational drugs. °· Do not drink alcohol. °· Avoid caffeine. °Contact a health care provider if: °· You have light vaginal bleeding or spotting while pregnant. °· You have abdominal pain or cramping. °· You have a fever. °Get help right away if: °· You have heavy vaginal bleeding. °· You have blood clots coming from your vagina. °· You have severe low back pain or abdominal cramps. °· You have fever, chills, and severe abdominal pain. °This information is not intended to replace advice given to you by your health care provider. Make sure you discuss any questions you have with your health care provider. °Document Released: 04/20/2005 Document Revised: 09/26/2015 Document Reviewed: 02/14/2013 °Elsevier Interactive Patient Education © 2018 Elsevier Inc[employer] ° °

## 2017-07-16 NOTE — MAU Provider Note (Signed)
History     CSN: [id number]  Arrival date and time: 07/16/17 1630   First Provider Initiated Contact with Patient 07/16/17 1758      Chief Complaint  Patient presents with  . Threatened Miscarriage   HPI Rita Acosta is a 27 y.o. G1P0 at 2978w5d who presents with vaginal bleeding. Reports some pink spotting yesterday and today passed a mass of gray tissue. Sent a picture to her MD & was told it could be a miscarriage. States bleeding has not continued. Denies abdominal pain. Denies n/v/d, fever/chills, dysuria, vaginal discharge, or recent intercourse. [facility]   OB History    Gravida Para Term Preterm AB Living   1             SAB TAB Ectopic Multiple Live Births                  Past Medical History:  Diagnosis Date  . Infection    pneumonia as infant and malaria infection at 27 years old    Past Surgical History:  Procedure Laterality Date  . NO PAST SURGERIES      Family History  Problem Relation Age of Onset  . Hypertension Mother   . Diabetes Father     Social History   Tobacco Use  . Smoking status: Never Smoker  . Smokeless tobacco: Never Used  Substance Use Topics  . Alcohol use: No    Frequency: Never  . Drug use: No    Allergies: No Known Allergies  Medications Prior to Admission  Medication Sig Dispense Refill Last Dose  . FOLIC ACID PO Take 1 tablet by mouth daily.   06/08/2017 at Unknown time  . Prenat w/o A Vit-FeFum-FePo-FA (CONCEPT OB) 130-92.4-1 MG CAPS Take 1 tablet by mouth daily. 30 capsule 12   . vitamin B-6 (PYRIDOXINE) 25 MG tablet Take 1 tablet (25 mg total) by mouth daily. 30 tablet 0     Review of Systems  Constitutional: Negative.   Gastrointestinal: Negative.   Genitourinary: Positive for vaginal bleeding.   Physical Exam   Blood pressure 95/67, pulse 82, temperature 98.6 F (37 C), temperature source Oral, resp. rate 16, height 4\' 11"  (1.499 m), weight 114 lb (51.7 kg), last  menstrual period 04/29/2017, SpO2 100 %.  Physical Exam  Nursing note and vitals reviewed. Constitutional: She is oriented to person, place, and time. She appears well-developed and well-nourished. No distress.  HENT:  Head: Normocephalic and atraumatic.  Eyes: Conjunctivae are normal. Right eye exhibits no discharge. Left eye exhibits no discharge. No scleral icterus.  Neck: Normal range of motion.  Respiratory: Effort normal. No respiratory distress.  GI: Soft. There is no tenderness.  Genitourinary: No bleeding in the vagina.  Genitourinary Comments: Cervix closed/thick  Neurological: She is alert and oriented to person, place, and time.  Skin: Skin is warm and dry. She is not diaphoretic.  Psychiatric: She has a normal mood and affect. Her behavior is normal. Judgment and thought content normal.    MAU Course  Procedures No results found for this or any previous visit (from the past 24 hour(s)).  MDM Blood type B positive  Informal bedside ultrasound performed. IUP present. FHR 160s. Active movement noted.   Cervix closed Pt & spouse reassured  Assessment and Plan  A; 1. Threatened miscarriage   2. [redacted] weeks gestation of pregnancy   3. Normal IUP (intrauterine pregnancy) on prenatal ultrasound, first trimester    P:  Discharge home Pelvic rest Keep scheduled f/u with ob Discussed reasons to return to MAU  Judeth Horn 07/16/2017, 5:58 PM

## 2017-07-16 NOTE — Telephone Encounter (Signed)
Called and spoke with patient's husband. He reports that patient had one episode of vaginal bleeding which ws pictured in her mychart message. (it is impossible to tell in the message whether there is also tissue mixed with the blood, or if it is mucous).  He additionally reports that she has had daily bleeding with clots every day.   Because it is impossible to evaluate her over the phone and through pictures in her chart, I told him I am unable to say whether the patient is having a miscarriage. He is going to take her to the MAU to be evaluated. All of his questions were answered.  I additionally advised that in the future for potential emergencies they should call the clinic rather than using mychart which is not the best communication method for potential emergencies.  Howard PouchLauren Natajah Derderian, MD PGY-2 [facility]

## 2017-07-16 NOTE — Telephone Encounter (Signed)
Looks as though patient may be miscarrying. Tried to call back but no answer. LVM. Will try again in a little while.

## 2017-07-16 NOTE — MAU Note (Signed)
Pt reports some spotting off/on with pregnancy and last pm she passed ?tissue. Denies pain, denies bleeding.

## 2017-07-19 ENCOUNTER — Encounter: Payer: Self-pay | Admitting: Student in an Organized Health Care Education/Training Program

## 2017-07-28 ENCOUNTER — Encounter: Payer: Self-pay | Admitting: Student in an Organized Health Care Education/Training Program

## 2017-07-28 ENCOUNTER — Ambulatory Visit (INDEPENDENT_AMBULATORY_CARE_PROVIDER_SITE_OTHER): Payer: Self-pay | Admitting: Student in an Organized Health Care Education/Training Program

## 2017-07-28 ENCOUNTER — Other Ambulatory Visit: Payer: Self-pay

## 2017-07-28 VITALS — BP 98/62 | HR 92 | Temp 98.2°F | Wt 113.2 lb

## 2017-07-28 DIAGNOSIS — Z3401 Encounter for supervision of normal first pregnancy, first trimester: Secondary | ICD-10-CM

## 2017-07-28 DIAGNOSIS — O09211 Supervision of pregnancy with history of pre-term labor, first trimester: Secondary | ICD-10-CM

## 2017-07-28 NOTE — Patient Instructions (Addendum)
Please schedule follow up in 4 weeks.  First Trimester of Pregnancy The first trimester of pregnancy is from week 1 until the end of week 13 (months 1 through 3). During this time, your baby will begin to develop inside you. At 6-8 weeks, the eyes and face are formed, and the heartbeat can be seen on ultrasound. At the end of 12 weeks, all the baby's organs are formed. Prenatal care is all the medical care you receive before the birth of your baby. Make sure you get good prenatal care and follow all of your doctor's instructions. Follow these instructions at home: Medicines  Take over-the-counter and prescription medicines only as told by your doctor. Some medicines are safe and some medicines are not safe during pregnancy.  Take a prenatal vitamin that contains at least 600 micrograms (mcg) of folic acid.  If you have trouble pooping (constipation), take medicine that will make your stool soft (stool softener) if your doctor approves. Eating and drinking  Eat regular, healthy meals.  Your doctor will tell you the amount of weight gain that is right for you.  Avoid raw meat and uncooked cheese.  If you feel sick to your stomach (nauseous) or throw up (vomit): ? Eat 4 or 5 small meals a day instead of 3 large meals. ? Try eating a few soda crackers. ? Drink liquids between meals instead of during meals.  To prevent constipation: ? Eat foods that are high in fiber, like fresh fruits and vegetables, whole grains, and beans. ? Drink enough fluids to keep your pee (urine) clear or pale yellow. Activity  Exercise only as told by your doctor. Stop exercising if you have cramps or pain in your lower belly (abdomen) or low back.  Do not exercise if it is too hot, too humid, or if you are in a place of great height (high altitude).  Try to avoid standing for long periods of time. Move your legs often if you must stand in one place for a long time.  Avoid heavy lifting.  Wear low-heeled  shoes. Sit and stand up straight.  You can have sex unless your doctor tells you not to. Relieving pain and discomfort  Wear a good support bra if your breasts are sore.  Take warm water baths (sitz baths) to soothe pain or discomfort caused by hemorrhoids. Use hemorrhoid cream if your doctor says it is okay.  Rest with your legs raised if you have leg cramps or low back pain.  If you have puffy, bulging veins (varicose veins) in your legs: ? Wear support hose or compression stockings as told by your doctor. ? Raise (elevate) your feet for 15 minutes, 3-4 times a day. ? Limit salt in your food. Prenatal care  Schedule your prenatal visits by the twelfth week of pregnancy.  Write down your questions. Take them to your prenatal visits.  Keep all your prenatal visits as told by your doctor. This is important. Safety  Wear your seat belt at all times when driving.  Make a list of emergency phone numbers. The list should include numbers for family, friends, the hospital, and police and fire departments. General instructions  Ask your doctor for a referral to a local prenatal class. Begin classes no later than at the start of month 6 of your pregnancy.  Ask for help if you need counseling or if you need help with nutrition. Your doctor can give you advice or tell you where to go for help.  Do not use hot tubs, steam rooms, or saunas.  Do not douche or use tampons or scented sanitary pads.  Do not cross your legs for long periods of time.  Avoid all herbs and alcohol. Avoid drugs that are not approved by your doctor.  Do not use any tobacco products, including cigarettes, chewing tobacco, and electronic cigarettes. If you need help quitting, ask your doctor. You may get counseling or other support to help you quit.  Avoid cat litter boxes and soil used by cats. These carry germs that can cause birth defects in the baby and can cause a loss of your baby (miscarriage) or  stillbirth.  Visit your dentist. At home, brush your teeth with a soft toothbrush. Be gentle when you floss. Contact a doctor if:  You are dizzy.  You have mild cramps or pressure in your lower belly.  You have a nagging pain in your belly area.  You continue to feel sick to your stomach, you throw up, or you have watery poop (diarrhea).  You have a bad smelling fluid coming from your vagina.  You have pain when you pee (urinate).  You have increased puffiness (swelling) in your face, hands, legs, or ankles. Get help right away if:  You have a fever.  You are leaking fluid from your vagina.  You have spotting or bleeding from your vagina.  You have very bad belly cramping or pain.  You gain or lose weight rapidly.  You throw up blood. It may look like coffee grounds.  You are around people who have [place] measles, fifth disease, or chickenpox.  You have a very bad headache.  You have shortness of breath.  You have any kind of trauma, such as from a fall or a car accident. Summary  The first trimester of pregnancy is from week 1 until the end of week 13 (months 1 through 3).  To take care of yourself and your unborn baby, you will need to eat healthy meals, take medicines only if your doctor tells you to do so, and do activities that are safe for you and your baby.  Keep all follow-up visits as told by your doctor. This is important as your doctor will have to ensure that your baby is healthy and growing well. This information is not intended to replace advice given to you by your health care provider. Make sure you discuss any questions you have with your health care provider. Document Released: 10/07/2007 Document Revised: 04/28/2016 Document Reviewed: 04/28/2016 Elsevier Interactive Patient Education  2017 [employer].

## 2017-07-28 NOTE — Progress Notes (Addendum)
Rita Acosta is a 27 y.o. G1P0 at 1772w3d here for routine follow up.  She reports bleeding and cramping. See flow sheet for details.  A/P: Pregnancy at 172w3d.    Pregnancy issues include:  1. Bleeding in pregnancy: first trimester.  Patient has been having issues with bleeding with clots. She has this on most days.  She has been seen in the MAU multiple times and in our office.  Her informal bedside ultrasound today in our office showed IUP with positive Dopplers and FHT of 151. Patient has sent multiple pictures of the bleeding and clotting that she has had, most recently 12 days ago, which are included in the patient emails which can be viewed in epic on 3/18 and 3/15.  - spoke with OB provider on call who recommended getting another formal ultrasound, eval whether there is a subchorionic hemorrhage now present. It has been about 4 weeks since last formal ultrasound - will follow up after receiving results of the ultrasound.   2. Nausea - dec appetite, no vomiting. She has had 4 lb weight loss since last visit. She has B6 which she has not been taking, she says due to nausea. - advised small frequent meals, try eating carbs (she asked if pizza was ok)  Patient is interested in genetic screening, ordered integrated screen today. Patient and her husband understand this may not be covered by insurance. Bleeding and pain precautions reviewed. Follow up 4 weeks.

## 2017-07-30 ENCOUNTER — Encounter: Payer: Self-pay | Admitting: Student in an Organized Health Care Education/Training Program

## 2017-07-30 ENCOUNTER — Telehealth: Payer: Self-pay | Admitting: Student in an Organized Health Care Education/Training Program

## 2017-07-30 NOTE — Telephone Encounter (Signed)
Husband would like to talk to dr Mosetta Puttfeng about scheduling a test.

## 2017-08-02 ENCOUNTER — Encounter: Payer: Self-pay | Admitting: Student in an Organized Health Care Education/Training Program

## 2017-08-04 ENCOUNTER — Ambulatory Visit (HOSPITAL_COMMUNITY): Payer: PRIVATE HEALTH INSURANCE

## 2017-08-06 ENCOUNTER — Other Ambulatory Visit: Payer: Self-pay | Admitting: Student in an Organized Health Care Education/Training Program

## 2017-08-06 ENCOUNTER — Ambulatory Visit (HOSPITAL_COMMUNITY)
Admission: RE | Admit: 2017-08-06 | Discharge: 2017-08-06 | Disposition: A | Discharge status: Home / Self Care | Payer: PRIVATE HEALTH INSURANCE | Source: Ambulatory Visit | Attending: Family Medicine | Admitting: Family Medicine

## 2017-08-06 DIAGNOSIS — O208 Other hemorrhage in early pregnancy: Secondary | ICD-10-CM | POA: Insufficient documentation

## 2017-08-06 DIAGNOSIS — O09211 Supervision of pregnancy with history of pre-term labor, first trimester: Secondary | ICD-10-CM

## 2017-08-06 DIAGNOSIS — Z3A13 13 weeks gestation of pregnancy: Secondary | ICD-10-CM | POA: Insufficient documentation

## 2017-08-09 ENCOUNTER — Encounter: Payer: Self-pay | Admitting: Student in an Organized Health Care Education/Training Program

## 2017-08-09 ENCOUNTER — Telehealth: Payer: Self-pay | Admitting: Student in an Organized Health Care Education/Training Program

## 2017-08-09 NOTE — Telephone Encounter (Signed)
Pt called and would like to talk to Dr. Mosetta PuttFeng. Pt has some questions and would like Feng to call them back to discuss them. Pt also mentioned wanting to know the results of a test done. Please advise

## 2017-08-10 ENCOUNTER — Ambulatory Visit (HOSPITAL_COMMUNITY): Payer: PRIVATE HEALTH INSURANCE

## 2017-08-11 ENCOUNTER — Encounter: Payer: Self-pay | Admitting: Student in an Organized Health Care Education/Training Program

## 2017-08-11 NOTE — Telephone Encounter (Signed)
The patient emailed and asked if she had to have this done. Explained again that it is an optional test and she elected to cancel.

## 2017-08-17 ENCOUNTER — Encounter: Payer: Self-pay | Admitting: Student in an Organized Health Care Education/Training Program

## 2017-08-17 ENCOUNTER — Other Ambulatory Visit: Payer: Self-pay

## 2017-08-17 ENCOUNTER — Ambulatory Visit (INDEPENDENT_AMBULATORY_CARE_PROVIDER_SITE_OTHER): Payer: Self-pay | Admitting: Student in an Organized Health Care Education/Training Program

## 2017-08-17 VITALS — BP 100/64 | HR 84 | Temp 97.8°F | Wt 114.0 lb

## 2017-08-17 DIAGNOSIS — Z3401 Encounter for supervision of normal first pregnancy, first trimester: Secondary | ICD-10-CM

## 2017-08-17 DIAGNOSIS — O4690 Antepartum hemorrhage, unspecified, unspecified trimester: Secondary | ICD-10-CM

## 2017-08-17 DIAGNOSIS — O468X9 Other antepartum hemorrhage, unspecified trimester: Secondary | ICD-10-CM

## 2017-08-17 DIAGNOSIS — O418X90 Other specified disorders of amniotic fluid and membranes, unspecified trimester, not applicable or unspecified: Secondary | ICD-10-CM

## 2017-08-17 NOTE — Patient Instructions (Signed)
It was a pleasure seeing you today in our clinic. We will have to schedule your anatomy ultrasound in the future because the imaging center is closed for the day.   A consult was placed to OB/GYN at today's visit.  You will receive a call to schedule an appointment. If you do not receive a call within two weeks please call our office so we can place the consult again.   Our clinic's number is [phone]. Please call with questions or concerns about what we discussed today.  Be well, Dr. Mosetta PuttFeng

## 2017-08-17 NOTE — Progress Notes (Signed)
Rita Acosta is a 27 y.o. G1P0 at 5947w2d here for routine follow up.  She reports bleeding, fatigue, headache and nausea. See flow sheet for details.  Rita Acosta is a 27 y.o. G1P0 at 548w3d here for routine follow up.  She reports bleeding and cramping. See flow sheet for details.  A/P: Pregnancy at 5147w2d.  Doing well.      Pregnancy issues include:  1. Bleeding in pregnancy: found to have subchorionic hemorrhage on ultrasound.  Patient has been having issues with bleeding with clots. She has this on most days.  She has been seen in the MAU multiple times and in our office.  Her informal bedside ultrasound today in our office showed IUP with cardiac activity noted on the ultrasound. She was found to have subchorionic hemorrhage on her last ultrasound.  - Because of persistent bleeding and symptoms with this, the visit was precepted and it was agreed that a referral to high risk OB is appropriate at this time.  2. Nausea - dec appetite, no vomiting. Her weight is stable from last visit. She has B6 which she has not been taking, she says due to nausea. - advised small frequent meals, she tolerates rice  3. Headaches - occasional. OB 100/64. No Edema. Patient may use tylenol for headaches.  4. Anatomy ultrasound ordered to be scheduled at 18-19 weeks. Patient is not interested in genetic screening. Bleeding and pain precautions reviewed. Follow up 4 weeks.  Howard PouchLauren Maddix Kliewer, MD PGY-2 [facility]

## 2017-08-18 NOTE — Addendum Note (Signed)
Addended by: Lamonte SakaiZIMMERMAN RUMPLE, APRIL D on: 08/18/2017 12:13 PM   Modules accepted: Orders

## 2017-08-19 ENCOUNTER — Other Ambulatory Visit: Payer: Self-pay

## 2017-08-19 ENCOUNTER — Encounter (HOSPITAL_COMMUNITY): Payer: Self-pay | Admitting: *Deleted

## 2017-08-19 ENCOUNTER — Telehealth: Payer: Self-pay | Admitting: Student in an Organized Health Care Education/Training Program

## 2017-08-19 ENCOUNTER — Inpatient Hospital Stay (HOSPITAL_COMMUNITY): Payer: Self-pay

## 2017-08-19 ENCOUNTER — Inpatient Hospital Stay (HOSPITAL_COMMUNITY)
Admission: AD | Admit: 2017-08-19 | Discharge: 2017-08-19 | Disposition: A | Discharge status: Home / Self Care | Payer: Self-pay | Source: Ambulatory Visit | Attending: Family Medicine | Admitting: Family Medicine

## 2017-08-19 DIAGNOSIS — O208 Other hemorrhage in early pregnancy: Secondary | ICD-10-CM

## 2017-08-19 DIAGNOSIS — O219 Vomiting of pregnancy, unspecified: Secondary | ICD-10-CM | POA: Insufficient documentation

## 2017-08-19 DIAGNOSIS — Z3A15 15 weeks gestation of pregnancy: Secondary | ICD-10-CM | POA: Insufficient documentation

## 2017-08-19 DIAGNOSIS — O4692 Antepartum hemorrhage, unspecified, second trimester: Secondary | ICD-10-CM | POA: Insufficient documentation

## 2017-08-19 DIAGNOSIS — O99012 Anemia complicating pregnancy, second trimester: Secondary | ICD-10-CM | POA: Insufficient documentation

## 2017-08-19 DIAGNOSIS — O209 Hemorrhage in early pregnancy, unspecified: Secondary | ICD-10-CM

## 2017-08-19 DIAGNOSIS — D649 Anemia, unspecified: Secondary | ICD-10-CM | POA: Insufficient documentation

## 2017-08-19 LAB — CBC
HCT: 29.3 % — ABNORMAL LOW (ref 36.0–46.0)
HEMOGLOBIN: 10.1 g/dL — AB (ref 12.0–15.0)
MCH: 25.8 pg — ABNORMAL LOW (ref 26.0–34.0)
MCHC: 34.5 g/dL (ref 30.0–36.0)
MCV: 74.7 fL — ABNORMAL LOW (ref 78.0–100.0)
Platelets: 239 10*3/uL (ref 150–400)
RBC: 3.92 MIL/uL (ref 3.87–5.11)
RDW: 14.6 % (ref 11.5–15.5)
WBC: 13 10*3/uL — AB (ref 4.0–10.5)

## 2017-08-19 MED ORDER — DOXYLAMINE-PYRIDOXINE 10-10 MG PO TBEC: 1.0000 | DELAYED_RELEASE_TABLET | Freq: Two times a day (BID) | ORAL | 0 refills | Status: DC

## 2017-08-19 NOTE — MAU Provider Note (Addendum)
History     CSN: [id number]  Arrival date and time: 08/19/17 1157   None     Chief Complaint  Patient presents with  . Vaginal Bleeding  . Back Pain   HPI 27 yo G1P0 at [redacted]w[redacted]d here for the evaluation of vaginal bleeding in pregnancy. Patient reports onset of vaginal bleeding over a week ago and was diagnosed with a subchorionic hemorrhage. Patient states that she experienced intermittent vaginal bleeding for the past week with occasional passage of clots. She states that the bleeding became very heavy today and was bright red. She also describes some associated lower pelvic cramping pain  OB History    Gravida  1   Para      Term      Preterm      AB      Living        SAB      TAB      Ectopic      Multiple      Live Births              Past Medical History:  Diagnosis Date  . Infection    pneumonia as infant and malaria infection at 58 years old    Past Surgical History:  Procedure Laterality Date  . NO PAST SURGERIES      Family History  Problem Relation Age of Onset  . Hypertension Mother   . Diabetes Father     Social History   Tobacco Use  . Smoking status: Never Smoker  . Smokeless tobacco: Never Used  Substance Use Topics  . Alcohol use: No    Frequency: Never  . Drug use: No    Allergies: No Known Allergies  Medications Prior to Admission  Medication Sig Dispense Refill Last Dose  . FOLIC ACID PO Take 1 tablet by mouth daily.   06/08/2017 at Unknown time  . Prenat w/o A Vit-FeFum-FePo-FA (CONCEPT OB) 130-92.4-1 MG CAPS Take 1 tablet by mouth daily. 30 capsule 12   . vitamin B-6 (PYRIDOXINE) 25 MG tablet Take 1 tablet (25 mg total) by mouth daily. 30 tablet 0     Review of Systems  See pertinent in HPI Physical Exam   Last menstrual period 04/29/2017.  Physical Exam GENERAL: Well-developed, well-nourished female in no acute distress.  ABDOMEN: Soft, nontender, nondistended. No organomegaly. PELVIC: Normal external  female genitalia. Vagina is pink and rugated.  Normal discharge. Normal appearing cervix. 20 cc blood clot evacuated from vaginal vault.  Cervix is closed, thick and posterior EXTREMITIES: No cyanosis, clubbing, or edema, 2+ distal pulses.  MAU Course  Procedures  MDM CBC and pelvic ultrasound ordered to assess subchorionic hemorrhage  Assessment and Plan  Care turned over to Leola Brazil, CNM  Peggy Constant 08/19/2017, 12:59 PM  [place] Mfm Ob Limited  Result Date: 08/19/2017 ----------------------------------------------------------------------  OBSTETRICS REPORT                      (Signed Final 08/19/2017 01:59 pm) ---------------------------------------------------------------------- Patient Info  ID #:       [id number]                          D.O.B.:  08-16-1990 (27 yrs)  Name:       Rita Acosta                  Visit Date: 08/19/2017 01:15 pm ---------------------------------------------------------------------- Performed By  Performed By:  Lenise Arena        Ref. Address:     Faculty                    RDMS  Attending:        Ledon Snare MD         Location:         [facility]  Referred By:      Catalina Antigua MD ---------------------------------------------------------------------- Orders   #  Description                                 Code   1  [place] MFM OB LIMITED                           [phone]  ----------------------------------------------------------------------   #  Ordered By               Order #        Accession #    Episode #   1  PEGGY CONSTANT           [id number]      [id number]     [id number]  ---------------------------------------------------------------------- Indications   [redacted] weeks gestation of pregnancy                Z3A.14   Vaginal bleeding in pregnancy, second          O46.92   trimester  ---------------------------------------------------------------------- OB History  Blood Type:            Height:  4'11"  Weight (lb):  114       BMI:   23.02  Gravidity:    1 ---------------------------------------------------------------------- Fetal Evaluation  Num Of Fetuses:     1  Fetal Heart         143  Rate(bpm):  Cardiac Activity:   Observed  Presentation:       Cephalic  Placenta:           Anterior, above cervical os  Amniotic Fluid  AFI FV:      Subjectively within normal limits                              Largest Pocket(cm)                              4.29 ---------------------------------------------------------------------- Gestational Age  Best:          14w 4d     Det. ByMarcella Dubs         EDD:   02/13/18                                      (06/28/17) ---------------------------------------------------------------------- Anatomy  Thoracic:              Appears normal         Bladder:                Appears normal  Stomach:               Appears normal, left  sided ---------------------------------------------------------------------- Cervix Uterus Adnexa  Cervix  Normal appearance by transabdominal scan.  Uterus  No abnormality visualized.  Left Ovary  Size(cm)     2.71   x   1.29   x  1.71      Vol(ml): 3.1  Within normal limits.  Right Ovary  Size(cm)     3.05   x   1.53   x  2.17      Vol(ml): 5.3  Within normal limits.  Cul De Sac:   No free fluid seen.  Adnexa:       No abnormality visualized. ---------------------------------------------------------------------- Impression  Singleton intrauterine pregnancy at 14 weeks 4 days  gestation with fetal cardiac activity  Cephalic presentation  Anterior placenta without evidence of previa  Normal appearing amniotic fluid volume  Subchorionic hemorrhage noted measuring 1.6 x 0.4 x 0.5 cm  Normal appearing cervix  No abnormal adnexal massess noted on ultrasound ---------------------------------------------------------------------- Recommendations  Follow-up ultrasounds as clinically indicated. ----------------------------------------------------------------------                    Ledon SnareBrian Brost, MD Electronically Signed Final Report   08/19/2017 01:59 pm ----------------------------------------------------------------------  Results for orders placed or performed during the hospital encounter of 08/19/17 (from the past 24 hour(s))  CBC     Status: Abnormal   Collection Time: 08/19/17 12:43 PM  Result Value Ref Range   WBC 13.0 (H) 4.0 - 10.5 K/uL   RBC 3.92 3.87 - 5.11 MIL/uL   Hemoglobin 10.1 (L) 12.0 - 15.0 g/dL   HCT 16.129.3 (L) 09.636.0 - 04.546.0 %   MCV 74.7 (L) 78.0 - 100.0 fL   MCH 25.8 (L) 26.0 - 34.0 pg   MCHC 34.5 30.0 - 36.0 g/dL   RDW 40.914.6 81.111.5 - 91.415.5 %   Platelets 239 150 - 400 K/uL   Addendum: Appetite poor and not eating high iron foods.  Persistent nausea and rare vomiting.  Taking B6 without improvement.  Reassured re:  fetal well-being today.  Advised pelvic rest until next visit and bleeding stops.  Return for heavy bleeding, orthostatic symptoms, severe abdominal pain.   ASSESSMENT:. G1 at 7370w0d viable IUP 1. Vaginal bleeding in pregnancy, second trimester   2. Vaginal bleeding affecting early pregnancy   3. Nausea and vomiting during pregnancy prior to [redacted] weeks gestation   4. Borderline anemia    PLAN: Allergies as of 08/19/2017   No Known Allergies     Medication List    STOP taking these medications   vitamin B-6 25 MG tablet Commonly known as:  pyridOXINE     TAKE these medications   CONCEPT OB 130-92.4-1 MG Caps Take 1 tablet by mouth daily.   Doxylamine-Pyridoxine 10-10 MG Tbec Commonly known as:  DICLEGIS Take 1 tablet by mouth 2 (two) times daily.     Add Fe SO4 325mg  1 po q pm  Follow-up Information    [facility] Schedule an appointment as soon as possible for a visit.   Specialty:  Obstetrics and Gynecology Why:  Keep appoinotment as scheduled Contact information: [address] [place] [id number] [phone]

## 2017-08-19 NOTE — Telephone Encounter (Signed)
Pt wanted to let Dr Mosetta PuttFeng know that she is in the hospital.

## 2017-08-19 NOTE — Progress Notes (Signed)
IV to left AC removed intact.  Dressing applied.

## 2017-08-19 NOTE — Discharge Instructions (Signed)
Morning Sickness Morning sickness is when you feel sick to your stomach (nauseous) during pregnancy. This nauseous feeling may or may not come with vomiting. It often occurs in the morning but can be a problem any time of day. Morning sickness is most common during the first trimester, but it may continue throughout pregnancy. While morning sickness is unpleasant, it is usually harmless unless you develop severe and continual vomiting (hyperemesis gravidarum). This condition requires more intense treatment. What are the causes? The cause of morning sickness is not completely known but seems to be related to normal hormonal changes that occur in pregnancy. What increases the risk? You are at greater risk if you:  Experienced nausea or vomiting before your pregnancy.  Had morning sickness during a previous pregnancy.  Are pregnant with more than one baby, such as twins.  How is this treated? Do not use any medicines (prescription, over-the-counter, or herbal) for morning sickness without first talking to your health care provider. Your health care provider may prescribe or recommend:  Vitamin B6 supplements.  Anti-nausea medicines.  The herbal medicine ginger.  Follow these instructions at home:  Only take over-the-counter or prescription medicines as directed by your health care provider.  Taking multivitamins before getting pregnant can prevent or decrease the severity of morning sickness in most women.  Eat a piece of dry toast or unsalted crackers before getting out of bed in the morning.  Eat five or six small meals a day.  Eat dry and bland foods (rice, baked potato). Foods high in carbohydrates are often helpful.  Do not drink liquids with your meals. Drink liquids between meals.  Avoid greasy, fatty, and spicy foods.  Get someone to cook for you if the smell of any food causes nausea and vomiting.  If you feel nauseous after taking prenatal vitamins, take the vitamins at  night or with a snack.  Snack on protein foods (nuts, yogurt, cheese) between meals if you are hungry.  Eat unsweetened gelatins for desserts.  Wearing an acupressure wristband (worn for sea sickness) may be helpful.  Acupuncture may be helpful.  Do not smoke.  Get a humidifier to keep the air in your house free of odors.  Get plenty of fresh air. Contact a health care provider if:  Your home remedies are not working, and you need medicine.  You feel dizzy or lightheaded.  You are losing weight. Get help right away if:  You have persistent and uncontrolled nausea and vomiting.  You pass out (faint). This information is not intended to replace advice given to you by your health care provider. Make sure you discuss any questions you have with your health care provider. Document Released: 06/11/2006 Document Revised: 09/26/2015 Document Reviewed: 10/05/2012 Elsevier Interactive Patient Education  2017 Elsevier Inc. Vaginal Bleeding During Pregnancy, Second Trimester A small amount of bleeding (spotting) from the vagina is common in pregnancy. Sometimes the bleeding is normal and is not a problem, and sometimes it is a sign of something serious. Be sure to tell your doctor about any bleeding from your vagina right away. Follow these instructions at home:  Watch your condition for any changes.  Follow your doctor's instructions about how active you can be.  If you are on bed rest: ? You may need to stay in bed and only get up to use the bathroom. ? You may be allowed to do some activities. ? If you need help, make plans for someone to help you.  Write down: ?  The number of pads you use each day. ? How often you change pads. ? How soaked (saturated) your pads are.  Do not use tampons.  Do not douche.  Do not have sex or orgasms until your doctor says it is okay.  If you pass any tissue from your vagina, save the tissue so you can show it to your doctor.  Only take  medicines as told by your doctor.  Do not take aspirin because it can make you bleed.  Do not exercise, lift heavy weights, or do any activities that take a lot of energy and effort unless your doctor says it is okay.  Keep all follow-up visits as told by your doctor. Contact a doctor if:  You bleed from your vagina.  You have cramps.  You have labor pains.  You have a fever that does not go away after you take medicine. Get help right away if:  You have very bad cramps in your back or belly (abdomen).  You have contractions.  You have chills.  You pass large clots or tissue from your vagina.  You bleed more.  You feel light-headed or weak.  You pass out (faint).  You are leaking fluid or have a gush of fluid from your vagina. This information is not intended to replace advice given to you by your health care provider. Make sure you discuss any questions you have with your health care provider. Document Released: 09/04/2013 Document Revised: 09/26/2015 Document Reviewed: 12/26/2012 Elsevier Interactive Patient Education  [employer]2018 Elsevier Inc.

## 2017-08-19 NOTE — MAU Note (Signed)
Pt presents with c/o VB that began this morning.  Denies clot, states passed clots 2 days ago.  Denies abdominal cramping/pain, but reports mild lower back pain.

## 2017-08-24 ENCOUNTER — Telehealth: Payer: Self-pay | Admitting: *Deleted

## 2017-08-24 NOTE — Telephone Encounter (Signed)
[facility] scheduled [facility]17 [facility] 10:30[facility]. Pt informed of this and was told to bring ins card, ID, and to arrive 15 minutes early.  Number given in case pt needs to reschedule. Lamonte SakaiZimmerman Rumple, April D, [place]

## 2017-08-24 NOTE — Telephone Encounter (Signed)
-----   Message from Howard PouchLauren Feng, MD sent at 08/17/2017  5:06 PM EDT ----- Unable to schedule an anatomy scan because imaging center was closed this afternoon. Can you please schedule the anatomy ultrasound for about 4 weeks from now? It is ordered.  Thank you, Leotis ShamesLauren

## 2017-08-27 ENCOUNTER — Encounter: Payer: PRIVATE HEALTH INSURANCE | Admitting: Student in an Organized Health Care Education/Training Program

## 2017-08-30 ENCOUNTER — Inpatient Hospital Stay (HOSPITAL_COMMUNITY)
Admission: AD | Admit: 2017-08-30 | Discharge: 2017-08-31 | DRG: 779 | Disposition: A | Discharge status: Home / Self Care | Payer: PRIVATE HEALTH INSURANCE | Source: Ambulatory Visit | Attending: Obstetrics and Gynecology | Admitting: Obstetrics and Gynecology

## 2017-08-30 ENCOUNTER — Inpatient Hospital Stay (HOSPITAL_COMMUNITY): Payer: PRIVATE HEALTH INSURANCE

## 2017-08-30 ENCOUNTER — Encounter (HOSPITAL_COMMUNITY): Payer: Self-pay | Admitting: *Deleted

## 2017-08-30 DIAGNOSIS — Z3A16 16 weeks gestation of pregnancy: Secondary | ICD-10-CM

## 2017-08-30 DIAGNOSIS — O42912 Preterm premature rupture of membranes, unspecified as to length of time between rupture and onset of labor, second trimester: Secondary | ICD-10-CM | POA: Diagnosis present

## 2017-08-30 DIAGNOSIS — O468X2 Other antepartum hemorrhage, second trimester: Secondary | ICD-10-CM

## 2017-08-30 DIAGNOSIS — O021 Missed abortion: Principal | ICD-10-CM | POA: Diagnosis present

## 2017-08-30 DIAGNOSIS — O418X20 Other specified disorders of amniotic fluid and membranes, second trimester, not applicable or unspecified: Secondary | ICD-10-CM

## 2017-08-30 DIAGNOSIS — O42919 Preterm premature rupture of membranes, unspecified as to length of time between rupture and onset of labor, unspecified trimester: Secondary | ICD-10-CM

## 2017-08-30 DIAGNOSIS — O411290 Chorioamnionitis, unspecified trimester, not applicable or unspecified: Secondary | ICD-10-CM | POA: Clinically undetermined

## 2017-08-30 DIAGNOSIS — O411220 Chorioamnionitis, second trimester, not applicable or unspecified: Secondary | ICD-10-CM | POA: Diagnosis present

## 2017-08-30 DIAGNOSIS — O4692 Antepartum hemorrhage, unspecified, second trimester: Secondary | ICD-10-CM

## 2017-08-30 HISTORY — DX: Anemia, unspecified: D64.9

## 2017-08-30 LAB — CBC
HCT: 29.4 % — ABNORMAL LOW (ref 36.0–46.0)
HEMOGLOBIN: 10 g/dL — AB (ref 12.0–15.0)
MCH: 25.8 pg — ABNORMAL LOW (ref 26.0–34.0)
MCHC: 34 g/dL (ref 30.0–36.0)
MCV: 76 fL — ABNORMAL LOW (ref 78.0–100.0)
PLATELETS: 262 10*3/uL (ref 150–400)
RBC: 3.87 MIL/uL (ref 3.87–5.11)
RDW: 15.5 % (ref 11.5–15.5)
WBC: 14.8 10*3/uL — AB (ref 4.0–10.5)

## 2017-08-30 NOTE — MAU Note (Signed)
PT SAYS SHE HAS BLED ALL PREG.    AT 1045PM- WENT  TO [facility] BLOOD -  FEELS LIKE SOMETHING IS COMING  OUT

## 2017-08-30 NOTE — MAU Provider Note (Signed)
  History     CSN: [id number]  Arrival date and time: 08/30/17 2308   First Provider Initiated Contact with Patient 08/30/17 2319      Chief Complaint  Patient presents with  . Vaginal Bleeding   HPI  Ms.  Rita Acosta is a 27 y.o. year old G1P0 female at [redacted]w[redacted]d weeks gestation who presents to MAU reporting heavy VB and "feeling a cord or something stuck in vagina" since 2245. She has a known [facility] and has had VB off and on throughout this pregnancy. She denies any recent SI.   Past Medical History:  Diagnosis Date  . Infection    pneumonia as infant and malaria infection at 66 years old    Past Surgical History:  Procedure Laterality Date  . NO PAST SURGERIES      Family History  Problem Relation Age of Onset  . Hypertension Mother   . Diabetes Father     Social History   Tobacco Use  . Smoking status: Never Smoker  . Smokeless tobacco: Never Used  Substance Use Topics  . Alcohol use: No    Frequency: Never  . Drug use: No    Allergies: No Known Allergies  Medications Prior to Admission  Medication Sig Dispense Refill Last Dose  . Doxylamine-Pyridoxine (DICLEGIS) 10-10 MG TBEC Take 1 tablet by mouth 2 (two) times daily. 60 tablet 0   . Prenat w/o A Vit-FeFum-FePo-FA (CONCEPT OB) 130-92.4-1 MG CAPS Take 1 tablet by mouth daily. 30 capsule 12 08/18/2017 at Unknown time    Review of Systems Physical Exam   Last menstrual period 04/29/2017.  Physical Exam  Nursing note and vitals reviewed. Constitutional: She is oriented to person, place, and time. She appears well-developed and well-nourished.  HENT:  Head: Normocephalic and atraumatic.  Eyes: Pupils are equal, round, and reactive to light.  Neck: Normal range of motion.  Cardiovascular: Normal rate, regular rhythm and normal heart sounds.  Respiratory: Effort normal.  GI: Soft.  Genitourinary:  Genitourinary Comments: Umbilical cord noted upon insertion of speculum in vagina; exam stopped at that time  with gentle removal of speculum and patient placed in trendelenburg position.  Musculoskeletal: Normal range of motion.  Neurological: She is alert and oriented to person, place, and time.  Skin: Skin is warm and dry.  Psychiatric: She has a normal mood and affect. Her behavior is normal. Judgment and thought content normal.    MAU Course  Procedures  MDM CBC OB Limited U/S: oligohydramnios, fetal head in cervix, unable to appreciate cord on U/S, but visualized with speculum by CNM Speculum Exam  Assessment and Plan  PPROM - Admit to L&D for delivery - Routine L&D admission orders - Report given to Dr. Nira Retort and care assumed at the time of admission  Rita Mora, MSN, CNM 08/30/2017, 11:23 PM

## 2017-08-31 ENCOUNTER — Encounter (HOSPITAL_COMMUNITY): Payer: Self-pay

## 2017-08-31 DIAGNOSIS — O42919 Preterm premature rupture of membranes, unspecified as to length of time between rupture and onset of labor, unspecified trimester: Secondary | ICD-10-CM | POA: Diagnosis present

## 2017-08-31 DIAGNOSIS — O42012 Preterm premature rupture of membranes, onset of labor within 24 hours of rupture, second trimester: Secondary | ICD-10-CM

## 2017-08-31 DIAGNOSIS — Z3A16 16 weeks gestation of pregnancy: Secondary | ICD-10-CM

## 2017-08-31 DIAGNOSIS — O411290 Chorioamnionitis, unspecified trimester, not applicable or unspecified: Secondary | ICD-10-CM | POA: Clinically undetermined

## 2017-08-31 HISTORY — DX: Preterm premature rupture of membranes, unspecified as to length of time between rupture and onset of labor, unspecified trimester: O42.919

## 2017-08-31 LAB — DIFFERENTIAL
BASOS ABS: 0 10*3/uL (ref 0.0–0.1)
BASOS PCT: 0 %
EOS ABS: 0.1 10*3/uL (ref 0.0–0.7)
Eosinophils Relative: 1 %
Lymphocytes Relative: 20 %
Lymphs Abs: 2.9 10*3/uL (ref 0.7–4.0)
MONOS PCT: 4 %
Monocytes Absolute: 0.5 10*3/uL (ref 0.1–1.0)
Neutro Abs: 11 10*3/uL — ABNORMAL HIGH (ref 1.7–7.7)
Neutrophils Relative %: 75 %

## 2017-08-31 LAB — TYPE AND SCREEN
ABO/RH(D): B POS
ANTIBODY SCREEN: NEGATIVE

## 2017-08-31 LAB — RAPID URINE DRUG SCREEN, HOSP PERFORMED
AMPHETAMINES: NOT DETECTED
Barbiturates: NOT DETECTED
Benzodiazepines: NOT DETECTED
Cocaine: NOT DETECTED
OPIATES: NOT DETECTED
TETRAHYDROCANNABINOL: NOT DETECTED

## 2017-08-31 LAB — RPR: RPR: NONREACTIVE

## 2017-08-31 MED ORDER — OXYTOCIN 40 UNITS IN LACTATED RINGERS INFUSION - SIMPLE MED
2.5000 [IU]/h | INTRAVENOUS | Status: DC
  Filled 2017-08-31: qty 1000

## 2017-08-31 MED ORDER — MISOPROSTOL 200 MCG PO TABS
800.0000 ug | ORAL_TABLET | Freq: Once | ORAL | Status: AC
  Administered 2017-08-31: 800 ug via VAGINAL

## 2017-08-31 MED ORDER — OXYTOCIN 10 UNIT/ML IJ SOLN: 10.0000 [IU] | Freq: Once | INTRAMUSCULAR | Status: DC | PRN

## 2017-08-31 MED ORDER — IBUPROFEN 600 MG PO TABS
600.0000 mg | ORAL_TABLET | Freq: Once | ORAL | Status: AC
  Administered 2017-08-31: 600 mg via ORAL
  Filled 2017-08-31: qty 1

## 2017-08-31 MED ORDER — METHYLERGONOVINE MALEATE 0.2 MG/ML IJ SOLN
0.2000 mg | Freq: Once | INTRAMUSCULAR | Status: AC | PRN
  Administered 2017-08-31: 0.2 mg via INTRAMUSCULAR
  Filled 2017-08-31: qty 1

## 2017-08-31 MED ORDER — ONDANSETRON HCL 4 MG/2ML IJ SOLN: 4.0000 mg | Freq: Four times a day (QID) | INTRAMUSCULAR | Status: DC | PRN

## 2017-08-31 MED ORDER — LIDOCAINE HCL (PF) 1 % IJ SOLN
30.0000 mL | INTRAMUSCULAR | Status: DC | PRN
  Filled 2017-08-31: qty 30

## 2017-08-31 MED ORDER — LACTATED RINGERS IV SOLN: 500.0000 mL | INTRAVENOUS | Status: DC | PRN

## 2017-08-31 MED ORDER — OXYCODONE-ACETAMINOPHEN 5-325 MG PO TABS: 1.0000 | ORAL_TABLET | ORAL | Status: DC | PRN

## 2017-08-31 MED ORDER — FENTANYL CITRATE (PF) 100 MCG/2ML IJ SOLN: 50.0000 ug | INTRAMUSCULAR | Status: DC | PRN

## 2017-08-31 MED ORDER — MISOPROSTOL 200 MCG PO TABS: 400.0000 ug | ORAL_TABLET | ORAL | Status: DC | PRN

## 2017-08-31 MED ORDER — LACTATED RINGERS IV SOLN
INTRAVENOUS | Status: DC
  Administered 2017-08-31: via INTRAVENOUS

## 2017-08-31 MED ORDER — ACETAMINOPHEN 325 MG PO TABS
650.0000 mg | ORAL_TABLET | ORAL | Status: DC | PRN
  Administered 2017-08-31: 650 mg via ORAL
  Filled 2017-08-31: qty 2

## 2017-08-31 MED ORDER — FENTANYL CITRATE (PF) 100 MCG/2ML IJ SOLN
INTRAMUSCULAR | Status: AC
  Administered 2017-08-31: 100 ug via INTRAVENOUS
  Filled 2017-08-31: qty 2

## 2017-08-31 MED ORDER — FENTANYL CITRATE (PF) 100 MCG/2ML IJ SOLN
100.0000 ug | INTRAMUSCULAR | Status: DC | PRN
  Administered 2017-08-31 (×2): 100 ug via INTRAVENOUS
  Filled 2017-08-31: qty 2

## 2017-08-31 MED ORDER — LACTATED RINGERS IV SOLN: INTRAVENOUS | Status: DC

## 2017-08-31 MED ORDER — SOD CITRATE-CITRIC ACID 500-334 MG/5ML PO SOLN: 30.0000 mL | ORAL | Status: DC | PRN

## 2017-08-31 MED ORDER — OXYTOCIN BOLUS FROM INFUSION: 500.0000 mL | Freq: Once | INTRAVENOUS | Status: DC

## 2017-08-31 MED ORDER — MISOPROSTOL 200 MCG PO TABS
ORAL_TABLET | ORAL | Status: AC
  Filled 2017-08-31: qty 4

## 2017-08-31 MED ORDER — OXYCODONE-ACETAMINOPHEN 5-325 MG PO TABS: 2.0000 | ORAL_TABLET | ORAL | Status: DC | PRN

## 2017-08-31 NOTE — Progress Notes (Signed)
cytotec PV placed.   Cornelia Copa MD Attending [facility] for [employer] (Faculty Practice) 08/31/2017 Time: [phone]

## 2017-08-31 NOTE — H&P (Addendum)
History   CSN: [id number]  Arrival date and time: 08/30/17 2308   First Provider Initiated Contact with Patient 08/30/17 2319         Chief Complaint  Patient presents with  . Vaginal Bleeding   HPI  Ms.  Rita Acosta is a 27 y.o. year old G1P0 female at [redacted]w[redacted]d weeks gestation who presents to MAU reporting heavy VB and "feeling a cord or something stuck in vagina" since 2245. She has a known [facility] and has had VB off and on throughout this pregnancy. She denies any recent SI.       Past Medical History:  Diagnosis Date  . Infection    pneumonia as infant and malaria infection at 16 years old         Past Surgical History:  Procedure Laterality Date  . NO PAST SURGERIES           Family History  Problem Relation Age of Onset  . Hypertension Mother   . Diabetes Father     Social History        Tobacco Use  . Smoking status: Never Smoker  . Smokeless tobacco: Never Used  Substance Use Topics  . Alcohol use: No    Frequency: Never  . Drug use: No    Allergies: No Known Allergies         Medications Prior to Admission  Medication Sig Dispense Refill Last Dose  . Doxylamine-Pyridoxine (DICLEGIS) 10-10 MG TBEC Take 1 tablet by mouth 2 (two) times daily. 60 tablet 0   . Prenat w/o A Vit-FeFum-FePo-FA (CONCEPT OB) 130-92.4-1 MG CAPS Take 1 tablet by mouth daily. 30 capsule 12 08/18/2017 at Unknown time    Review of Systems Physical Exam   Last menstrual period 04/29/2017.  Physical Exam  Nursing note and vitals reviewed. Constitutional: She is oriented to person, place, and time. She appears well-developed and well-nourished.  HENT:  Head: Normocephalic and atraumatic.  Eyes: Pupils are equal, round, and reactive to light.  Neck: Normal range of motion.  Cardiovascular: Normal rate, regular rhythm and normal heart sounds.  Respiratory: Effort normal.  GI: Soft.  Genitourinary:  Genitourinary Comments: Umbilical cord noted upon  insertion of speculum in vagina; exam stopped at that time with gentle removal of speculum and patient placed in trendelenburg position.  Musculoskeletal: Normal range of motion.  Neurological: She is alert and oriented to person, place, and time.  Skin: Skin is warm and dry.  Psychiatric: She has a normal mood and affect. Her behavior is normal. Judgment and thought content normal.    MAU Course  Procedures  MDM CBC OB Limited U/S: oligohydramnios, fetal head in cervix, unable to appreciate cord on U/S, but visualized with speculum by CNM Speculum Exam  Assessment and Plan  PPROM - Admit to L&D for delivery - Routine L&D admission orders - Report given to Dr. Nira Retort and care assumed at the time of admission  Raelyn Mora, MSN, CNM 08/30/2017, 11:23 PM    Attestation of Attending Supervision of Certified Nurse Midwife: Evaluation and management procedures were performed by the CNM under my supervision.  I have seen and examined the patient,  reviewed the CNM's note and chart, and I agree with the management and plan.   Presents with VB and likely PPROM yesterday night per patient history and confirmed with her.   S: pt not having any PTL s/s AF VS normal and stable (improving mild tachycardia) NAD GU: deferred  A/P: PPROM, inevitable AB Long  d/w pt and husband. I d/w them that she has PPROM'ed with prolapse of the cord, per CNM exam, into the cervical canal. FHR just checked now and normal in the 150s. I d/w them re: options in the MAU and came back to go over any questions in how they would like to proceed. D/w them that fetus is too early to intervene and that given above events that IUFD will likely occur at some point with exp management and definitely would proceed with IOL but risk of heavy bleeding and infection are also at risk of occurring before IUFD or after. I told them that I recommend proceeding with IOL for inevitable AB. I told them that if they desire exp  management we will continue to doppler the fetus q60-173m and if still alive and no change in maternal status that can continue exp management on third floor. White count slightly elevated with left shift but was high about a week ago. Will continue to watch for any possible s/s of infection.   They asked if anything could've been done and I told them that on my review of the chart, that I don't see anything that should've been done differently and could've possibly affected the outcome. I told them after delivery has occurred a work up can be done postpartum, although usually the yield is negative; no genetics was done this pregnancy.  Pt to do exp management unless they desire to proceed with induction or if maternal status changes.  Will keep npo with mivf.   Cornelia Copa MD Attending [facility] [employer] (Faculty Practice) 08/31/2017 [phone]

## 2017-08-31 NOTE — Discharge Instructions (Signed)
Subchorionic Hematoma A subchorionic hematoma is a gathering of blood between the outer wall of the placenta and the inner wall of the womb (uterus). The placenta is the organ that connects the fetus to the wall of the uterus. The placenta performs the feeding, breathing (oxygen to the fetus), and waste removal (excretory work) of the fetus. Subchorionic hematoma is the most common abnormality found on a result from ultrasonography done during the first trimester or early second trimester of pregnancy. If there has been little or no vaginal bleeding, early small hematomas usually shrink on their own and do not affect your baby or pregnancy. The blood is gradually absorbed over 1-2 weeks. When bleeding starts later in pregnancy or the hematoma is larger or occurs in an older pregnant woman, the outcome may not be as good. Larger hematomas may get bigger, which increases the chances for miscarriage. Subchorionic hematoma also increases the risk of premature detachment of the placenta from the uterus, preterm (premature) labor, and stillbirth.   Miscarriage A miscarriage is the sudden loss of an unborn baby (fetus) before the 20th week of pregnancy. Most miscarriages happen in the first 3 months of pregnancy. Sometimes, it happens before a woman even knows she is pregnant. A miscarriage is also called a "spontaneous miscarriage" or "early pregnancy loss." Having a miscarriage can be an emotional experience. Talk with your caregiver about any questions you may have about miscarrying, the grieving process, and your future pregnancy plans. What are the causes?  Problems with the fetal chromosomes that make it impossible for the baby to develop normally. Problems with the baby's genes or chromosomes are most often the result of errors that occur, by chance, as the embryo divides and grows. The problems are not inherited from the parents.  Infection of the cervix or uterus.  Hormone problems.  Problems with  the cervix, such as having an incompetent cervix. This is when the tissue in the cervix is not strong enough to hold the pregnancy.  Problems with the uterus, such as an abnormally shaped uterus, uterine fibroids, or congenital abnormalities.  Certain medical conditions.  Smoking, drinking alcohol, or taking illegal drugs.  Trauma. Often, the cause of a miscarriage is unknown. What are the signs or symptoms?  Vaginal bleeding or spotting, with or without cramps or pain.  Pain or cramping in the abdomen or lower back.  Passing fluid, tissue, or blood clots from the vagina. How is this diagnosed? Your caregiver will perform a physical exam. You may also have an ultrasound to confirm the miscarriage. Blood or urine tests may also be ordered. How is this treated?  Sometimes, treatment is not necessary if you naturally pass all the fetal tissue that was in the uterus. If some of the fetus or placenta remains in the body (incomplete miscarriage), tissue left behind may become infected and must be removed. Usually, a dilation and curettage (D and C) procedure is performed. During a D and C procedure, the cervix is widened (dilated) and any remaining fetal or placental tissue is gently removed from the uterus.  Antibiotic medicines are prescribed if there is an infection. Other medicines may be given to reduce the size of the uterus (contract) if there is a lot of bleeding.  If you have Rh negative blood and your baby was Rh positive, you will need a Rh immunoglobulin shot. This shot will protect any future baby from having Rh blood problems in future pregnancies. Follow these instructions at home:  Your caregiver  may order bed rest or may allow you to continue light activity. Resume activity as directed by your caregiver.  Have someone help with home and family responsibilities during this time.  Keep track of the number of sanitary pads you use each day and how soaked (saturated) they are.  Write down this information.  Do not use tampons. Do not douche or have sexual intercourse until approved by your caregiver.  Only take over-the-counter or prescription medicines for pain or discomfort as directed by your caregiver.  Do not take aspirin. Aspirin can cause bleeding.  Keep all follow-up appointments with your caregiver.  If you or your partner have problems with grieving, talk to your caregiver or seek counseling to help cope with the pregnancy loss. Allow enough time to grieve before trying to get pregnant again. Get help right away if:  You have severe cramps or pain in your back or abdomen.  You have a fever.  You pass large blood clots (walnut-sized or larger) ortissue from your vagina. Save any tissue for your caregiver to inspect.  Your bleeding increases.  You have a thick, bad-smelling vaginal discharge.  You become lightheaded, weak, or you faint.  You have chills. This information is not intended to replace advice given to you by your health care provider. Make sure you discuss any questions you have with your health care provider. Document Released: 10/14/2000 Document Revised: 09/26/2015 Document Reviewed: 06/09/2011 Elsevier Interactive Patient Education  2017 [employer].

## 2017-08-31 NOTE — Progress Notes (Signed)
Discharge instructions reviewed with patient and follow up information given. Patient left on own accord by foot.

## 2017-08-31 NOTE — Progress Notes (Signed)
L&D Note  08/31/2017 - 2:57 AM  27 y.o. G1P0 [redacted]w[redacted]d. Pregnancy complicated by PPROM on 4/28, VB, umbilical cord prolapse  Patient Active Problem List   Diagnosis Date Noted  . Preterm premature rupture of membranes (PPROM) with unknown onset of labor 08/31/2017    [name] is admitted for inevitable AB   Subjective:  Pt not feeling any more pain or pressure but would like to be checked to see any progress  Objective:   Vitals:   08/31/17 0047 08/31/17 0058 08/31/17 0118 08/31/17 0201  BP: 103/69  96/80 98/68  Pulse: (!) 102  (!) 112 98  Resp:  19  17  Temp:  99.1 F (37.3 C)  98.9 F (37.2 C)  TempSrc:  Oral  Oral  Weight:      Height:        Current Vital Signs 24h Vital Sign Ranges  T 98.9 F (37.2 C) Temp  Avg: 99 F (37.2 C)  Min: 98.9 F (37.2 C)  Max: 99.1 F (37.3 C)  BP 98/68 BP  Min: 96/80  Max: 111/71  HR 98 Pulse  Avg: 106  Min: 98  Max: 112  RR 17 Resp  Avg: 18.7  Min: 17  Max: 20  SaO2     No data recorded       24 Hour I/O Current Shift I/O  Time Ins Outs No intake/output data recorded. No intake/output data recorded.   FHR: 150s Gen: NAD SSE: scant blood in vault, cervix seen with loop of moderately dilated umbilical cord seen (total length 3cm), no active bleeding  Labs:  Recent Labs  Lab 08/30/17 2329  WBC 14.8*  HGB 10.0*  HCT 29.4*  PLT 262   B POS  Pending: UCx, RPR, GC/CT Negative: UDS  Medications Current Facility-Administered Medications  Medication Dose Route Frequency Provider Last Rate Last Dose  . acetaminophen (TYLENOL) tablet 650 mg  650 mg Oral Q4H PRN Raelyn Mora, CNM      . fentaNYL (SUBLIMAZE) injection 50 mcg  50 mcg Intravenous Q1H PRN [place] Bing, MD      . lactated ringers infusion 500-1,000 mL  500-1,000 mL Intravenous PRN Raelyn Mora, CNM      . lactated ringers infusion 500-1,000 mL  500-1,000 mL Intravenous PRN [place] Bing, MD      . lactated ringers infusion   Intravenous  Continuous Raelyn Mora, CNM 125 mL/hr at 08/31/17 0010    . lactated ringers infusion   Intravenous Continuous [place] Bing, MD      . lidocaine (PF) (XYLOCAINE) 1 % injection 30 mL  30 mL Subcutaneous PRN Raelyn Mora, CNM      . methylergonovine (METHERGINE) injection 0.2 mg  0.2 mg Intramuscular Once PRN [place] Bing, MD      . misoprostol (CYTOTEC) tablet 800 mcg  800 mcg Vaginal Once [place] Bing, MD      . ondansetron [facility]) injection 4 mg  4 mg Intravenous Q6H PRN [place] Bing, MD      . oxytocin (PITOCIN) IV BOLUS FROM BAG  500 mL Intravenous Once Raelyn Mora, CNM      . oxytocin (PITOCIN) IV infusion 40 units in LR 1000 mL - Premix  2.5 Units/hr Intravenous Continuous Raelyn Mora, CNM      . sodium citrate-citric acid (ORACIT) solution 30 mL  30 mL Oral Q2H PRN Raelyn Mora, CNM        Assessment & Plan:  Pt stable *Inevitable AB: pt and husband would  like to proceed with IOL given unchanged exam. Will start with cytotec PV x 1 and repeat 400 PV q3h prn *Analgesia: IV PRNs and pt told can have epidural if desired but recommend check first as many times delivery is imminent if that uncomfortable  Cornelia Copa MD Attending Center for [facility])

## 2017-08-31 NOTE — Progress Notes (Signed)
Initial visit with Aryka and her husband after their son died.  Pt was clearly tearful and quiet.  Spouse shared that they are hoping she'll be cleared to discharge at 2:00.  Pt nurse shared that family was very emotional after the birth of their son and had several support people with them.  Pt is aware of how to reach spiritual care for subsequent needs.    Please page as further needs arise.  Maryanna Shape. Carley Hammed, M.Div. [facility] Chaplain Pager [phone] Office [phone]

## 2017-08-31 NOTE — Discharge Summary (Signed)
OB Discharge Summary     Patient Name: Rita Acosta DOB: [birth year] MRN: [id number]  Date of admission: 08/30/2017 Delivering MD:  Bing   Date of discharge: 08/31/2017  Admitting diagnosis: 16wks passing large clots Intrauterine pregnancy: [redacted]w[redacted]d     Secondary diagnosis:  Active Problems:   Preterm premature rupture of membranes (PPROM) with unknown onset of labor   Chorioamnionitis  Additional problems: None     Discharge diagnosis: Complete miscarriage in second triemster                                                                                                Post partum procedures:None   Augmentation: Cytotec  Complications: Intrauterine Inflammation or infection (Chorioamniotis)  Hospital course:  Induction of Labor With Vaginal Delivery   27 y.o. yo G1P1 at [redacted]w[redacted]d was admitted to the hospital 08/30/2017 for induction of labor.  Indication for induction: PPROM with inevitable AB.  Presented to MAU reporting heavy VB and "feeling a cord or something stuck in vagina" since 2245.She had a known [facility] and had VB off and on throughout pregnancy. PPROM with cord prolapse.   Patient had an uncomplicated labor course as follows: Membrane Rupture Time/Date: 12:00 AM ,08/29/2017   Intrapartum Procedures: Episiotomy: None [1]                                         Lacerations:  None [1]  Patient had delivery of a Non Viable infant.  Information for the patient's newborn:  Laurina, Fischl [[id number]]  Delivery Method: Vaginal, Spontaneous(Filed from Delivery Summary)   08/31/2017  Details of delivery can be found in separate delivery note.  Patient had pp temperature to 100.43F. Given Tylenol x1 with no further fevers. Was observed for 6 hours s/p delivery with stable VSS and bleeding. Affect was flat at discharge. Patient is discharged home 08/31/17.   Physical exam  Vitals:   08/31/17 1001 08/31/17 1016 08/31/17 1031 08/31/17 1211  BP:  105/67  Pulse: 92 94 93 92  Resp: Temp:    98.5 F (36.9 C)  TempSrc:    Oral  Weight:      Height:       General: alert, cooperative and no distress Lochia: appropriate Uterine Fundus: firm Psych:  depressed mood, flat affect DVT Evaluation: No evidence of DVT seen on physical exam. Labs: Lab Results  Component Value Date   WBC 14.8 (H) 08/30/2017   HGB 10.0 (L) 08/30/2017   HCT 29.4 (L) 08/30/2017   MCV 76.0 (L) 08/30/2017   PLT 262 08/30/2017   No flowsheet data found.  Discharge instruction: per After Visit Summary and "Baby and Me Booklet".  After visit meds:  Allergies as of 08/31/2017   No Known Allergies     Medication List    TAKE these medications   CONCEPT OB 130-92.4-1 MG Caps Take 1 tablet by mouth daily.   Doxylamine-Pyridoxine 10-10 MG Tbec Commonly known as:  DICLEGIS Take 1 tablet by mouth 2 (two) times daily.       Diet: routine diet  Activity: Advance as tolerated.   Follow up Appt: Future Appointments  Date Time Provider Department Center  09/09/2017  2:35 PM [place] Bing, MD [facility]   Newborn Data: Live born female  Birth Weight: 4.1 oz (116 g) APGAR: 0, 0  Newborn Delivery   Birth date/time:  08/31/2017 07:35:00 Delivery type:  Vaginal, Spontaneous     Disposition:morgue   08/31/2017 Caryl Ada, DO

## 2017-09-01 LAB — URINE CULTURE: SPECIAL REQUESTS: NORMAL

## 2017-09-09 ENCOUNTER — Ambulatory Visit (INDEPENDENT_AMBULATORY_CARE_PROVIDER_SITE_OTHER): Payer: Self-pay | Admitting: Obstetrics and Gynecology

## 2017-09-09 ENCOUNTER — Encounter: Payer: Self-pay | Admitting: Obstetrics and Gynecology

## 2017-09-09 ENCOUNTER — Ambulatory Visit (INDEPENDENT_AMBULATORY_CARE_PROVIDER_SITE_OTHER): Payer: Self-pay | Admitting: Clinical

## 2017-09-09 VITALS — BP 99/73 | HR 78 | Ht 61.0 in | Wt 115.0 lb

## 2017-09-09 DIAGNOSIS — Z8759 Personal history of other complications of pregnancy, childbirth and the puerperium: Secondary | ICD-10-CM

## 2017-09-09 DIAGNOSIS — F4321 Adjustment disorder with depressed mood: Secondary | ICD-10-CM

## 2017-09-09 NOTE — BH Specialist Note (Signed)
Integrated Behavioral Health Initial Visit  MRN: [id number] Name: Rita Acosta  Number of Integrated Behavioral Health Clinician visits:: 1/6 Session Start time: 3:20  Session End time: 3:44 Total time: 30 minutes  Type of Service: Integrated Behavioral Health- Individual/Family Interpretor:No. Interpretor Name and Language:n/a   Warm Hand Off Completed.       SUBJECTIVE: Rita Acosta is a 27 y.o. female accompanied by Spouse Patient was referred by Dr Vergie Living for grief . Patient reports the following symptoms/concerns: Pt and husband state they are both sad after pregnancy loss at 16wks, and are looking forward to a healthy baby in the near future.  Duration of problem: Over one week; Severity of problem: mild  OBJECTIVE: Mood: Depressed and Affect: Appropriate Risk of harm to self or others: No plan to harm self or others   LIFE CONTEXT: Family and Social: Pt lives with husband; supportive friends local, and supportive family in home country School/Work: Both parents work fulltime Self-Care: Eating, sleeping, and functioning well after loss Life Changes: Pregnancy loss at 16wks  GOALS ADDRESSED: Patient will 1. Demonstrate ability to: Begin healthy grieving over loss  INTERVENTIONS: Interventions utilized: Supportive Counseling and Link to [employer]  Standardized Assessments completed: GAD-7 and PHQ 9  ASSESSMENT: Patient currently experiencing Grief.    Patient may benefit from supportive counseling today, and connection to local resources for grief support.  PLAN: 1. Follow up with behavioral health clinician on : As requested by pt 2. Behavioral recommendations:  -Continue to allow friends and family to be supportive at this time -Consider [facility] grief support as needed in the future 3. Referral(s): Integrated Behavioral Health Services (In Clinic) 4. "From scale of 1-10, how likely are you to follow plan?": 10  Rae Lips,  LCSW    Depression screen [facility] 2/9 09/09/2017 08/17/2017 07/28/2017 06/29/2017  Decreased Interest 0 0 0 0  Down, Depressed, Hopeless 2 0 0 0  PHQ - 2 Score 2 0 0 0  Altered sleeping 0 - - -  Tired, decreased energy 1 - - -  Change in appetite 0 - - -  Feeling bad or failure about yourself  1 - - -  Trouble concentrating 0 - - -  Moving slowly or fidgety/restless 0 - - -  Suicidal thoughts 0 - - -  PHQ-9 Score 4 - - -  ' GAD 7 : Generalized Anxiety Score 09/09/2017  Nervous, Anxious, on Edge 2  Control/stop worrying 2  Worry too much - different things 2  Trouble relaxing 2  Restless 1  Easily annoyed or irritable 0  Afraid - awful might happen 2  Total GAD 7 Score 11

## 2017-09-10 ENCOUNTER — Encounter: Payer: Self-pay | Admitting: Obstetrics and Gynecology

## 2017-09-10 NOTE — Progress Notes (Addendum)
Obstetrics and Gynecology Visit Return Patient Evaluation  Appointment Date: 09/09/2017  Primary Care Provider: Howard Pouch  Chief Complaint: follow up 16wk loss  History of Present Illness:  Rita Acosta is a 27 y.o. s/p 4/29 SVD/intact perineum at 16/5wks. Pt with chronic vaginal bleeding during pregnancy and presented to MAU with PPROM on 4/28 and prolapsed umbilical cord. She developed peri-partum chorio but was treated with delivery only.   Interval History: patient and husband are coping appropriately. Lochia decreasing and ROS negative.   Review of Systems:  as noted in the History of Present Illness.   Medications: PNV, iron Allergies: has No Known Allergies.  Physical Exam:  BP 99/73   Pulse 78   Ht  (1.549 m)   Wt 115 lb (52.2 kg)   LMP 04/29/2017   BMI 21.73 kg/m  Body mass index is 21.73 kg/m. General appearance: Well nourished, well developed female in no acute distress.   EPDS 4, no thoughts of self harm  Assessment: Pt doing well  Plan:  1. History of pregnancy loss, not currently pregnant Long d/w pt and husband and as of right now no obvious etiology. I told them that any chronic vaginal bleeding in pregnancy is always concerning and raises the risk of miscarriage but until close to viability nothing can be done. She also had no e/o cervical insufficiency but even if she did, anything surgical wouldn't have been an option given the VB. I told them that the placenta did show chorio but it's unknown what came first but likely it happened due to her pprom and wasn't there first and caused all these issues. I told them that once she is 6-12 wks from delivery that we can investigate this further with bloodwork and an u/s although these are usually, unfortunately, low yield; they wish to pursue these options. I told them that until these are done i'd hold off on trying again, which they are fine with. I told her that she can d/c the PNV and iron and just do a  regular women's once a day MVI. Pt and husband are amenable to meeting with Rita Acosta today.  - US PELVIC COMPLETE WITH TRANSVAGINAL; Future   RTC: 5-6wks  Cornelia Copa MD Attending Center for [employer] [facility])

## 2017-09-13 ENCOUNTER — Other Ambulatory Visit (HOSPITAL_COMMUNITY): Payer: PRIVATE HEALTH INSURANCE

## 2017-09-13 ENCOUNTER — Encounter: Payer: PRIVATE HEALTH INSURANCE | Admitting: Obstetrics and Gynecology

## 2017-09-15 ENCOUNTER — Ambulatory Visit (HOSPITAL_COMMUNITY): Payer: PRIVATE HEALTH INSURANCE

## 2017-09-20 ENCOUNTER — Encounter: Payer: PRIVATE HEALTH INSURANCE | Admitting: Family Medicine

## 2017-10-14 ENCOUNTER — Ambulatory Visit (HOSPITAL_COMMUNITY)
Admission: RE | Admit: 2017-10-14 | Discharge: 2017-10-14 | Disposition: A | Discharge status: Home / Self Care | Payer: Self-pay | Source: Ambulatory Visit | Attending: Obstetrics and Gynecology | Admitting: Obstetrics and Gynecology

## 2017-10-14 ENCOUNTER — Ambulatory Visit (HOSPITAL_COMMUNITY): Payer: Self-pay

## 2017-10-14 DIAGNOSIS — Z8759 Personal history of other complications of pregnancy, childbirth and the puerperium: Secondary | ICD-10-CM | POA: Insufficient documentation

## 2017-10-14 DIAGNOSIS — N854 Malposition of uterus: Secondary | ICD-10-CM | POA: Insufficient documentation

## 2017-10-19 ENCOUNTER — Encounter: Payer: Self-pay | Admitting: Obstetrics and Gynecology

## 2017-10-19 ENCOUNTER — Ambulatory Visit (INDEPENDENT_AMBULATORY_CARE_PROVIDER_SITE_OTHER): Payer: Self-pay | Admitting: Obstetrics and Gynecology

## 2017-10-19 VITALS — BP 94/54 | HR 69 | Wt 121.1 lb

## 2017-10-19 DIAGNOSIS — Z8759 Personal history of other complications of pregnancy, childbirth and the puerperium: Secondary | ICD-10-CM

## 2017-10-20 DIAGNOSIS — Z8759 Personal history of other complications of pregnancy, childbirth and the puerperium: Secondary | ICD-10-CM | POA: Insufficient documentation

## 2017-10-20 NOTE — Progress Notes (Signed)
Obstetrics and Gynecology Visit Return Patient Evaluation  Appointment Date: 10/19/2017  Primary Care Provider: Howard PouchFeng, Lauren  [facility]  Chief Complaint: f/u u/s  History of Present Illness:  Rita Acosta is a 27 y.o. G1P0100 here for f/u u/s. Patient had 16wk VD after PPROM with preg c/b chronic VB.   Patient had u/s on 6/13 to look for any anomalies and the u/s was negative. Patient has had a period about a week ago.    Review of Systems: as noted in the History of Present Illness.   Medications:  Rita CatholicKhadiza Duchesneau had no medications administered during this visit. Current Outpatient Medications  Medication Sig Dispense Refill  . Prenat w/o A Vit-FeFum-FePo-FA (CONCEPT OB) 130-92.4-1 MG CAPS Take 1 tablet by mouth daily. 30 capsule 12   No current facility-administered medications for this visit.     Allergies: has No Known Allergies.  Physical Exam:  BP (!) 94/54   Pulse 69   Wt 121 lb 1.6 oz (54.9 kg)   LMP 10/12/2017 (Approximate)   BMI 22.88 kg/m  Body mass index is 22.88 kg/m. General appearance: Well nourished, well developed female in no acute distress.  Neuro/Psych:  Normal mood and affect.     Assessment: pt doing well  Plan:  1. History of pregnancy loss, not currently pregnant D/w pt that lab work up for pregnancy loss is usually negative and of limited value and wouldn't necessarily recommend it but if she'd like it then that's fine. They'd like the work up done. I also told her that they can go ahead and try and conceive again as soon as they are emotionally ready - TSH - Lupus anticoagulant panel - Factor V Leiden - Protein C activity - Protein S activity - Prothrombin Gene Mutation - Antiphospholipid Syndrome Comp   RTC: PRN  Cornelia Copaharlie Vivia Rosenburg, Jr MD Attending Center for [facility](Faculty Practice)

## 2017-10-28 LAB — ANTIPHOSPHOLIPID SYNDROME COMP
APTT: 31.2 s — AB
Antiphosphatidylserine IgG: 3 {GPS'U}
Antiphosphatidylserine IgM: 3 {MPS'U}
Antiprothrombin Antibody, IgG: 5 G units
Beta-2 Glycoprotein I, IgA: <10 SAU
Beta-2 Glycoprotein I, IgG: <10 SGU
Beta-2 Glycoprotein I, IgM: <10 SMU
DRVVT Screen Seconds: 43.3 s
Hexagonal Phospholipid Neutral: 2 s
Platelet Neutralization: 0 s

## 2017-10-28 LAB — PROTHROMBIN GENE MUTATION

## 2017-10-28 LAB — FACTOR V LEIDEN

## 2017-10-28 LAB — PROTEIN S ACTIVITY: PROTEIN S ACTIVITY: 80 % (ref 63–140)

## 2017-10-28 LAB — PROTEIN C ACTIVITY: Protein C Activity: 121 % (ref 73–180)

## 2017-10-28 LAB — LUPUS ANTICOAGULANT PANEL
DRVVT: 39.7 s (ref 0.0–47.0)
PTT LA: 41.4 s (ref 0.0–51.9)

## 2017-10-28 LAB — TSH: TSH: 0.671 u[IU]/mL (ref 0.450–4.500)

## 2017-11-02 ENCOUNTER — Encounter: Payer: Self-pay | Admitting: Obstetrics and Gynecology

## 2017-11-05 ENCOUNTER — Other Ambulatory Visit: Payer: Self-pay

## 2017-11-05 ENCOUNTER — Other Ambulatory Visit: Payer: Self-pay | Admitting: General Practice

## 2017-11-05 DIAGNOSIS — Z8759 Personal history of other complications of pregnancy, childbirth and the puerperium: Secondary | ICD-10-CM

## 2017-11-10 ENCOUNTER — Other Ambulatory Visit: Payer: Self-pay

## 2017-11-10 DIAGNOSIS — Z8759 Personal history of other complications of pregnancy, childbirth and the puerperium: Secondary | ICD-10-CM

## 2017-11-11 LAB — APTT: APTT: 28 s (ref 24–33)

## 2018-01-16 ENCOUNTER — Encounter (HOSPITAL_COMMUNITY): Payer: Self-pay

## 2018-01-16 ENCOUNTER — Inpatient Hospital Stay (HOSPITAL_COMMUNITY)
Admission: AD | Admit: 2018-01-16 | Discharge: 2018-01-16 | Disposition: A | Discharge status: Home / Self Care | Payer: Self-pay | Source: Ambulatory Visit | Attending: Family Medicine | Admitting: Family Medicine

## 2018-01-16 DIAGNOSIS — S4992XA Unspecified injury of left shoulder and upper arm, initial encounter: Secondary | ICD-10-CM

## 2018-01-16 DIAGNOSIS — M25512 Pain in left shoulder: Secondary | ICD-10-CM | POA: Insufficient documentation

## 2018-01-16 DIAGNOSIS — Y9301 Activity, walking, marching and hiking: Secondary | ICD-10-CM | POA: Insufficient documentation

## 2018-01-16 DIAGNOSIS — O9A219 Injury, poisoning and certain other consequences of external causes complicating pregnancy, unspecified trimester: Secondary | ICD-10-CM | POA: Insufficient documentation

## 2018-01-16 DIAGNOSIS — W109XXA Fall (on) (from) unspecified stairs and steps, initial encounter: Secondary | ICD-10-CM | POA: Insufficient documentation

## 2018-01-16 DIAGNOSIS — W19XXXA Unspecified fall, initial encounter: Secondary | ICD-10-CM

## 2018-01-16 DIAGNOSIS — M79622 Pain in left upper arm: Secondary | ICD-10-CM | POA: Insufficient documentation

## 2018-01-16 DIAGNOSIS — M25552 Pain in left hip: Secondary | ICD-10-CM | POA: Insufficient documentation

## 2018-01-16 DIAGNOSIS — Z3A00 Weeks of gestation of pregnancy not specified: Secondary | ICD-10-CM | POA: Insufficient documentation

## 2018-01-16 LAB — URINALYSIS, ROUTINE W REFLEX MICROSCOPIC
Bilirubin Urine: NEGATIVE
Glucose, UA: NEGATIVE mg/dL
Hgb urine dipstick: NEGATIVE
Ketones, ur: NEGATIVE mg/dL
Leukocytes, UA: NEGATIVE
Nitrite: NEGATIVE
Protein, ur: NEGATIVE mg/dL
Specific Gravity, Urine: 1.002 — ABNORMAL LOW (ref 1.005–1.030)
pH: 5 (ref 5.0–8.0)

## 2018-01-16 LAB — POCT PREGNANCY, URINE: PREG TEST UR: NEGATIVE

## 2018-01-16 NOTE — Discharge Instructions (Signed)

## 2018-01-16 NOTE — MAU Note (Signed)
Pt fell down the stairs and landed on her left side. Hurt her shoulder and left side 7/10. Had a miscarriage 4 months ago and wants to be "checked out"

## 2018-01-16 NOTE — MAU Provider Note (Signed)
History     CSN: [id number]  Arrival date and time: 01/16/18 1238   First Provider Initiated Contact with Patient 01/16/18 1554      Chief Complaint  Patient presents with  . Fall  . Shoulder Pain   Rita Acosta is a 27 y.o. G1P1 who presents today after a fall. She fell going down stairs and impacted on her shoulder and left side. She was attempting to run away from a dog. She denies hitting her head. She denies any LOC. She was traumatized for about 30 mins afterwards and was screaming. This is per husband as the patient has refused a medical interpretor. Husband is also requesting testing today to see why the patient is feeling tired. He reports that for about 1-2 months she has been a little more tired than usual. They are trying to have another baby now, and had a 16 week loss in April. So, they are concerned about this as well. They are also conerned that this fall could inhibit their ability to conceive.   Fall  The accident occurred 2 days ago. The fall occurred while walking (down stairs ). She fell from a height of 3 to 5 ft. She landed on hard floor. The point of impact was the left shoulder, left elbow and left hip. The pain is present in the left upper arm, left hip and left shoulder. The pain is at a severity of 7/10. Pertinent negatives include no abdominal pain, fever, headaches or nausea. She has tried nothing for the symptoms.    OB History    Gravida  1   Para  1   Term      Preterm      AB      Living        SAB      TAB      Ectopic      Multiple  0   Live Births              Past Medical History:  Diagnosis Date  . Anemia   . Infection    pneumonia as infant and malaria infection at 81 years old  . Preterm premature rupture of membranes (PPROM) with unknown onset of labor 08/31/2017    Past Surgical History:  Procedure Laterality Date  . NO PAST SURGERIES      Family History  Problem Relation Age of Onset  . Hypertension Mother    . Diabetes Father     Social History   Tobacco Use  . Smoking status: Never Smoker  . Smokeless tobacco: Never Used  Substance Use Topics  . Alcohol use: No    Frequency: Never  . Drug use: No    Allergies: No Known Allergies  Medications Prior to Admission  Medication Sig Dispense Refill Last Dose  . Prenat w/o A Vit-FeFum-FePo-FA (CONCEPT OB) 130-92.4-1 MG CAPS Take 1 tablet by mouth daily. 30 capsule 12 Taking    Review of Systems  Constitutional: Negative for fever.  Eyes: Negative for visual disturbance.  Gastrointestinal: Negative for abdominal pain and nausea.  Genitourinary: Negative for dysuria and vaginal bleeding.  Neurological: Negative for dizziness, syncope and headaches.   Physical Exam   Blood pressure 115/82, pulse 86, resp. rate 18, weight 56.2 kg, SpO2 100 %.  Physical Exam  Nursing note and vitals reviewed. Constitutional: She is oriented to person, place, and time. She appears well-developed and well-nourished. No distress.  HENT:  Head: Normocephalic.  Cardiovascular: Normal rate.  Respiratory: Effort normal.  GI: Soft. There is no tenderness. There is no rebound.  Musculoskeletal: Normal range of motion. She exhibits no tenderness.  2cm x2cm bruise on her left shoulder, non-tender. Full range of motion to this arm/shoulder   Neurological: She is alert and oriented to person, place, and time.  Skin: Skin is warm and dry.  Psychiatric: She has a normal mood and affect.    MAU Course  Procedures  MDM DW patient and her partner at length that she has only a very small bruise on her arm from the fall, and otherwise full range of motion and strength in all extremities. At this time she does not appear to have sustained a significant injury from the fall. Also discussed with patient and partner that it would be best to follow up with her PCP or OBGYN about her 2 month history of fatigue as this could be from a variety of causes that cannot be fully  evaluated here in the ED. Encouraged them to continue trying to conceive as she appears physically healthy at this time, and this fall will not have caused her to be unable to conceive.   Assessment and Plan   1. Fall, initial encounter    DC home Comfort measures reviewed  RX: none Return to MAU as needed   Follow-up Information    Howard PouchFeng, Lauren, MD Follow up.   Specialty:  Family Medicine Contact information: [address]  [place] [id number] [phone]           Thressa ShellerHeather Hogan 01/16/2018, 3:59 PM

## 2018-01-18 ENCOUNTER — Telehealth: Payer: Self-pay

## 2018-02-09 ENCOUNTER — Other Ambulatory Visit: Payer: Self-pay

## 2018-02-09 NOTE — Telephone Encounter (Signed)
Opened in error

## 2018-02-15 NOTE — Telephone Encounter (Signed)
Message sent via My chart

## 2018-05-02 ENCOUNTER — Other Ambulatory Visit: Payer: Self-pay

## 2018-05-02 DIAGNOSIS — N926 Irregular menstruation, unspecified: Secondary | ICD-10-CM

## 2018-05-03 LAB — BETA HCG QUANT (REF LAB)

## 2018-05-05 ENCOUNTER — Ambulatory Visit (INDEPENDENT_AMBULATORY_CARE_PROVIDER_SITE_OTHER): Payer: Self-pay | Admitting: *Deleted

## 2018-05-05 DIAGNOSIS — Z712 Person consulting for explanation of examination or test findings: Secondary | ICD-10-CM

## 2018-05-05 NOTE — Progress Notes (Signed)
Pt presents to discuss bhcg result.  Told pt bhcg was <1 which was negative for pregnancy.  Pt then asked why she is so late for her period.  It was due to start 04/17/18 based on previous periods and her cycles being regular and monthly.  Pt asked if there was a chance that, if she took the test again in a few days, that it would result positive.  Discussed with Dr. [name] and told pt that it was not likely that she would see a positive result.  If pt as far out from missed period as she states, then there should be some rise of bhcg by the time the test was drawn.  Pt verbalized understanding.

## 2018-05-06 NOTE — Progress Notes (Signed)
Agree with RN documentation and plan of care. Patient encouraged to schedule follow-up if continuing to have irregular periods.   Rita Acosta. [name], DO OB/GYN Fellow

## 2018-05-26 ENCOUNTER — Other Ambulatory Visit: Payer: Self-pay

## 2018-05-26 ENCOUNTER — Emergency Department (HOSPITAL_COMMUNITY)
Admission: EM | Admit: 2018-05-26 | Discharge: 2018-05-27 | Disposition: A | Discharge status: Home / Self Care | Payer: Self-pay | Attending: Emergency Medicine | Admitting: Emergency Medicine

## 2018-05-26 ENCOUNTER — Encounter (HOSPITAL_COMMUNITY): Payer: Self-pay | Admitting: *Deleted

## 2018-05-26 DIAGNOSIS — N938 Other specified abnormal uterine and vaginal bleeding: Secondary | ICD-10-CM | POA: Insufficient documentation

## 2018-05-26 DIAGNOSIS — R109 Unspecified abdominal pain: Secondary | ICD-10-CM | POA: Insufficient documentation

## 2018-05-26 LAB — CBC
HCT: 40.3 % (ref 36.0–46.0)
Hemoglobin: 12.8 g/dL (ref 12.0–15.0)
MCH: 24.5 pg — ABNORMAL LOW (ref 26.0–34.0)
MCHC: 31.8 g/dL (ref 30.0–36.0)
MCV: 77.1 fL — ABNORMAL LOW (ref 80.0–100.0)
NRBC: 0 % (ref 0.0–0.2)
PLATELETS: 224 10*3/uL (ref 150–400)
RBC: 5.23 MIL/uL — ABNORMAL HIGH (ref 3.87–5.11)
RDW: 13.8 % (ref 11.5–15.5)
WBC: 7.9 10*3/uL (ref 4.0–10.5)

## 2018-05-26 LAB — I-STAT BETA HCG BLOOD, ED (MC, WL, AP ONLY): I-stat hCG, quantitative: <5 m[IU]/mL (ref <5)

## 2018-05-26 NOTE — ED Triage Notes (Signed)
Pt reports vaginal bleeding since January 4th, reports as on and off since then, with clotting. Negative pregnancy. She has been feeling weak and having right back pain. Has appt with OBGYN scheduled for Monday

## 2018-05-27 NOTE — Discharge Instructions (Signed)
Your pregnancy test today was negative.  Your hemoglobin is normal.  There is not specific concern for the amount of bleeding you are experiencing, but we do advise follow-up with your OB/GYN as scheduled on Monday.  Take ibuprofen as needed for abdominal cramping.  Return to the ED for new or concerning symptoms.

## 2018-05-27 NOTE — ED Provider Notes (Signed)
[facility]   CSN: [id number] Arrival date & time: 05/26/18  2118     History   Chief Complaint Chief Complaint  Patient presents with  . Vaginal Bleeding    HPI Rita Acosta is a 28 y.o. female.  28 year old G1P0 female presents to the emergency department for evaluation of vaginal bleeding.  Spouse reports onset of vaginal bleeding after an episode of sexual intercourse on 05/07/2018.  Patient had approximately 2 days of bleeding before it spontaneously resolved.  Her bleeding began again on 05/14/2018 and has been waxing and waning, constant since this time.  She has not required more than 2 pads in the past 24 hours for bleeding management.  Patient became more concerned today when she was passing some clots.  She has been experiencing intermittent abdominal cramping associated with her vaginal bleeding.  The patient has not taken any medications for her symptoms last had a negative pregnancy test on 05/02/2018.  Is scheduled to see her OB/GYN on Monday, but opted to be evaluated in the ED today due to increasing lightheadedness at home.  She has not experienced any fever, syncope, dysuria.  The history is provided by the spouse and the patient.  Vaginal Bleeding    Past Medical History:  Diagnosis Date  . Anemia   . Infection    pneumonia as infant and malaria infection at 28 years old  . Preterm premature rupture of membranes (PPROM) with unknown onset of labor 08/31/2017    Patient Active Problem List   Diagnosis Date Noted  . History of pregnancy loss, not currently pregnant 10/20/2017    Past Surgical History:  Procedure Laterality Date  . NO PAST SURGERIES       OB History    Gravida  1   Para  1   Term      Preterm      AB      Living        SAB      TAB      Ectopic      Multiple  0   Live Births               Home Medications    Prior to Admission medications   Medication Sig  Start Date End Date Taking? Authorizing Provider  Prenat w/o A Vit-FeFum-FePo-FA (CONCEPT OB) 130-92.4-1 MG CAPS Take 1 tablet by mouth daily. 06/08/17   Dorathy KinsmanSmith, [place], CNM    Family History Family History  Problem Relation Age of Onset  . Hypertension Mother   . Diabetes Father     Social History Social History   Tobacco Use  . Smoking status: Never Smoker  . Smokeless tobacco: Never Used  Substance Use Topics  . Alcohol use: No    Frequency: Never  . Drug use: No     Allergies   Patient has no known allergies.   Review of Systems Review of Systems  Genitourinary: Positive for vaginal bleeding.  Ten systems reviewed and are negative for acute change, except as noted in the HPI.    Physical Exam Updated Vital Signs BP 111/77   Pulse 83   Temp 97.8 F (36.6 C) (Oral)   Resp 17   Ht 5\' 5"  (1.651 m)   Wt 57 kg   LMP 05/07/2018   SpO2 99%   BMI 20.91 kg/m   Physical Exam Vitals signs and nursing note reviewed.  Constitutional:      General:  She is not in acute distress.    Appearance: She is well-developed. She is not diaphoretic.     Comments: Nontoxic appearing and in NAD  HENT:     Head: Normocephalic and atraumatic.  Eyes:     General: No scleral icterus.    Conjunctiva/sclera: Conjunctivae normal.  Neck:     Musculoskeletal: Normal range of motion.  Pulmonary:     Effort: Pulmonary effort is normal. No respiratory distress.     Comments: Respirations even and unlabored Genitourinary:    Comments: Patient declines pelvic exam at this time Musculoskeletal: Normal range of motion.  Skin:    General: Skin is warm and dry.     Coloration: Skin is not pale.     Findings: No erythema or rash.  Neurological:     Mental Status: She is alert and oriented to person, place, and time.  Psychiatric:        Behavior: Behavior normal.      ED Treatments / Results  Labs (all labs ordered are listed, but only abnormal results are displayed) Labs  Reviewed  CBC - Abnormal; Notable for the following components:      Result Value   RBC 5.23 (*)    MCV 77.1 (*)    MCH 24.5 (*)    All other components within normal limits  I-STAT BETA HCG BLOOD, ED ([facility], [facility], [facility] ONLY)    EKG None  Radiology No results found.  Procedures Procedures (including critical care time)  Medications Ordered in ED Medications - No data to display   Initial Impression / Assessment and Plan / ED Course  I have reviewed the triage vital signs and the nursing notes.  Pertinent labs & imaging results that were available during my care of the patient were reviewed by me and considered in my medical decision making (see chart for details).     28 year old female presents for vaginal bleeding which has been constant and waxing and waning since 05/14/2018.  She is scheduled to see her OB/GYN on Monday, but felt increasingly lightheaded today with passage of clots.  The patient is nontoxic in appearance.  She is hemodynamically stable without tachycardia or hypotension.  Hemoglobin is normal, reassuring.  Pregnancy today is negative.  Symptoms consistent with uncomplicated DUB.  I do not believe further emergent work-up is indicated at this time.  A pelvic exam was offered to the patient, but she declines stating that she would prefer to have this done by her OB/GYN at her scheduled appointment.  Advised Tylenol or ibuprofen for abdominal cramping.  Return precautions discussed and provided. Patient discharged in stable condition with no unaddressed concerns.   Final Clinical Impressions(s) / ED Diagnoses   Final diagnoses:  Dysfunctional uterine bleeding    ED Discharge Orders    None       Antony MaduraHumes, Yaslyn Cumby, PA-C 05/27/18 0235    Dione BoozeGlick, David, MD 05/27/18 [phone]

## 2018-05-27 NOTE — ED Notes (Signed)
ED Provider at bedside. 

## 2018-08-04 ENCOUNTER — Ambulatory Visit (HOSPITAL_BASED_OUTPATIENT_CLINIC_OR_DEPARTMENT_OTHER): Admission: RE | Admit: 2018-08-04 | Payer: Self-pay | Source: Home / Self Care | Admitting: Obstetrics and Gynecology

## 2018-08-04 ENCOUNTER — Encounter (HOSPITAL_BASED_OUTPATIENT_CLINIC_OR_DEPARTMENT_OTHER): Admission: RE | Payer: Self-pay | Source: Home / Self Care

## 2018-08-04 SURGERY — DILATATION AND CURETTAGE /HYSTEROSCOPY
Anesthesia: Choice

## 2019-05-05 NOTE — L&D Delivery Note (Signed)
Operative Delivery Note At 12:32 PM a viable female was delivered via Vaginal, [employer] risk analyst).  Presentation: vertex; Position: Left,, Occiput, Anterior; LOT; Station: +2/+3.  Verbal consent: obtained from patient and partner.  Risks and benefits discussed in detail.  Risks include, but are not limited to the risks of anesthesia, bleeding, infection, damage to maternal tissues, fetal cephalhematoma.  There is also the risk of inability to effect vaginal delivery of the head, or shoulder dystocia that cannot be resolved by established maneuvers, leading to the need for emergency cesarean section.  APGAR: P ; weight 7 lb 3.3 oz (3270 g).   Placenta status: delivered, intact .   Cord: 3V with the following complications: shoulder nuchal.    Anesthesia:  epidural Instruments: Kiwi Episiotomy: None Lacerations: 3rd degree Suture Repair: 2.0 and 3.0 vicryl rapide Est. Blood Loss (mL): 267cc  Mom to postpartum.  Baby to NICU.  Rita Acosta 12/11/2019, 1:07 PM  B+/RI/ Br/Tdap in PNC/ Contra ? "[name]"

## 2019-05-13 IMAGING — US US OB TRANSVAGINAL
1 series · 15 of 28 positions shown · non-contrast
Comparison: None.

CLINICAL DATA: Vaginal bleeding or early in pregnancy.

EXAM:
OBSTETRIC <14 WK US AND TRANSVAGINAL OB US
TECHNIQUE: Both transabdominal and transvaginal ultrasound examinations were
performed for complete evaluation of the gestation as well as the
maternal uterus, adnexal regions, and pelvic cul-de-sac.
Transvaginal technique was performed to assess early pregnancy.

[Series 1: us ob transvaginal · 15 of 53 slices shown]
[im 1/53]
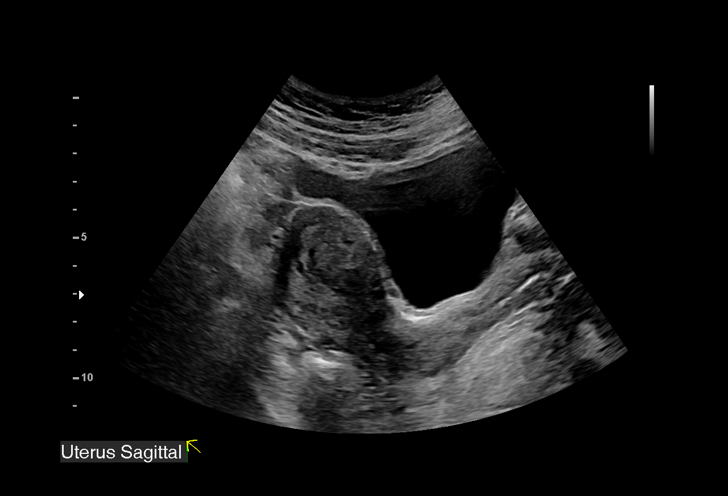
[im 4/53]
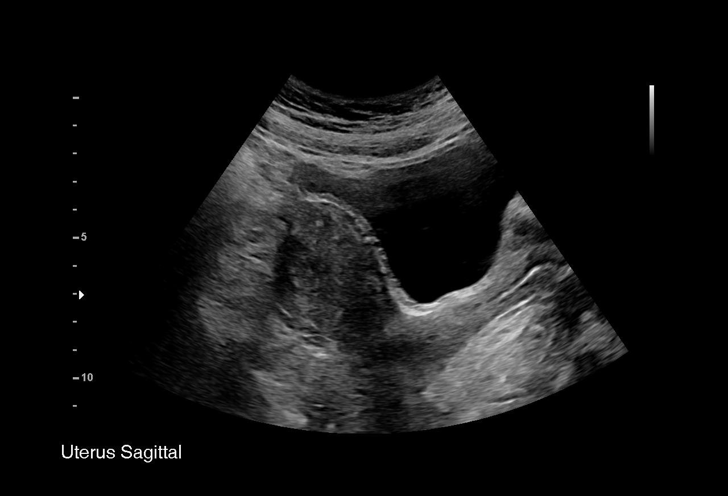
[im 8/53]
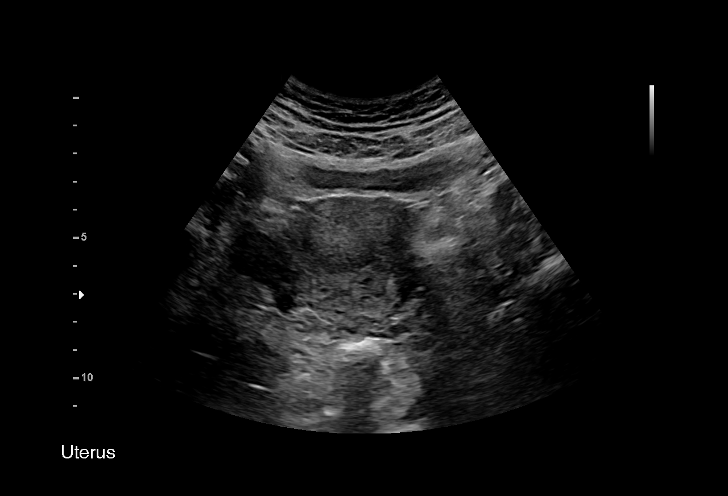
[im 12/53]
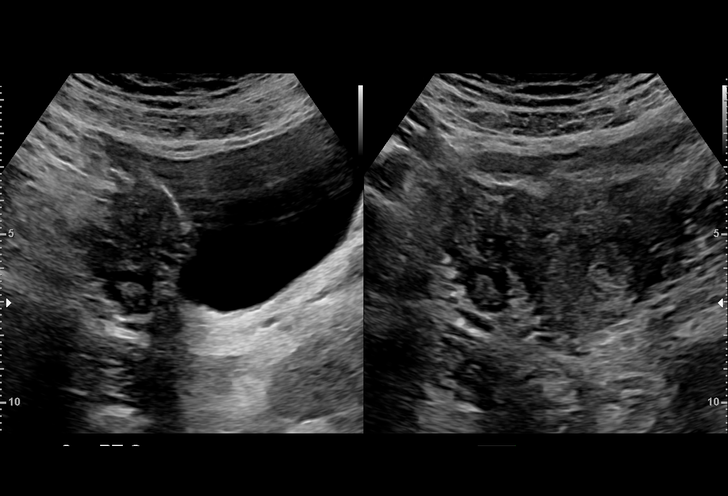
[im 16/53]
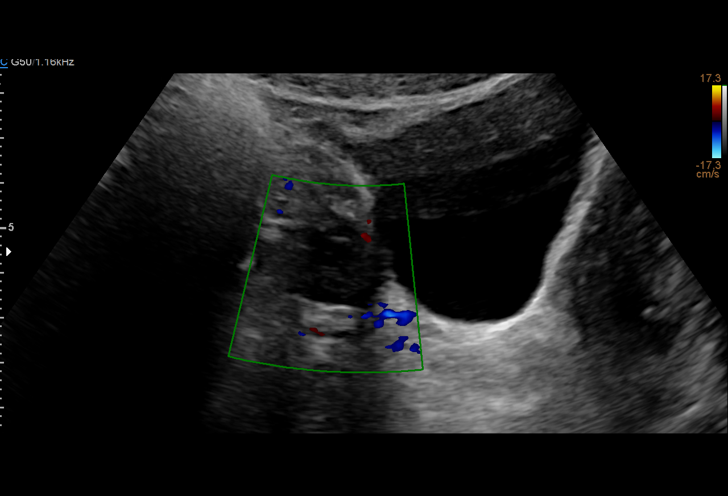
[im 20/53]
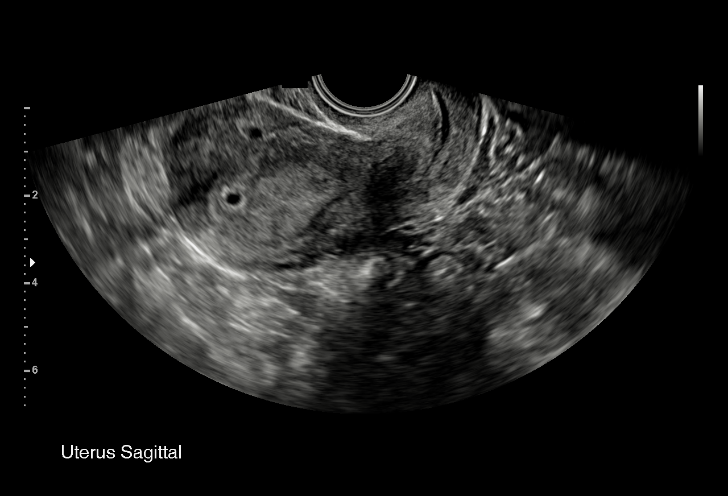
[im 24/53]
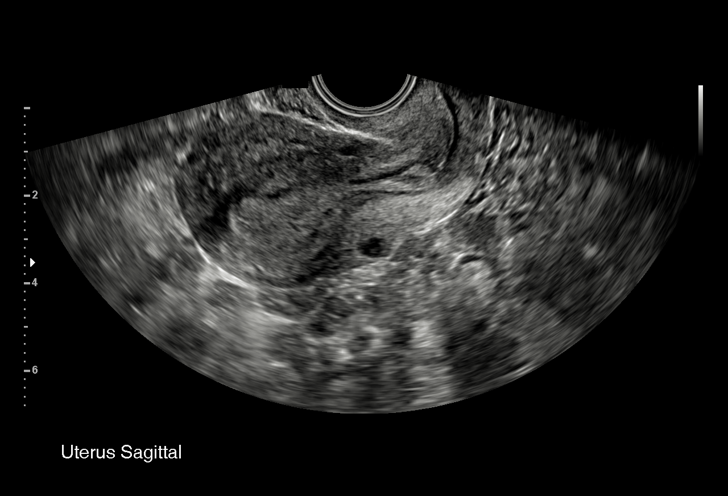
[im 27/53]
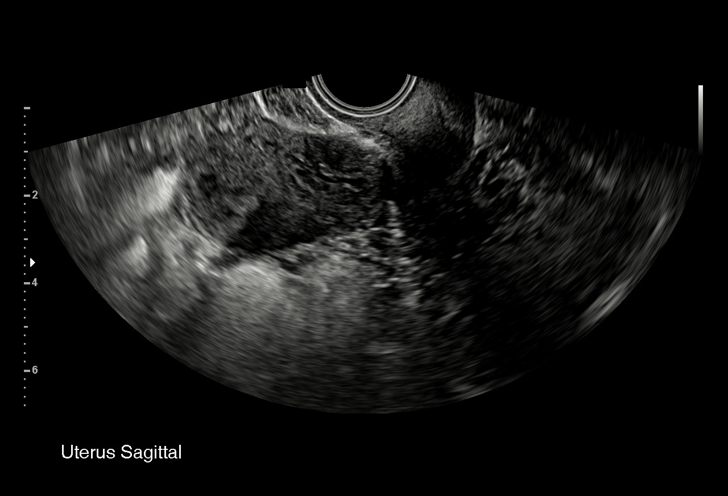
[im 29/53]
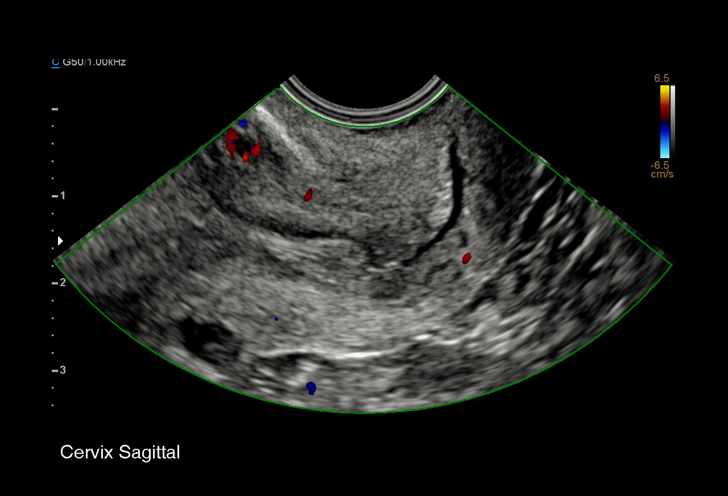
[im 33/53]
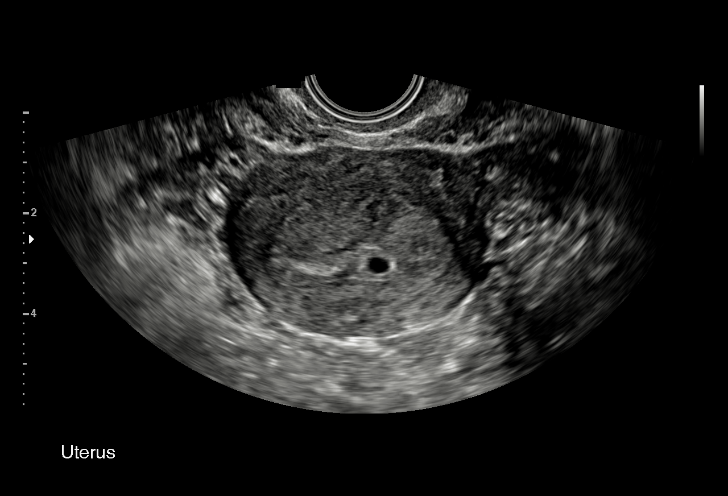
[im 37/53]
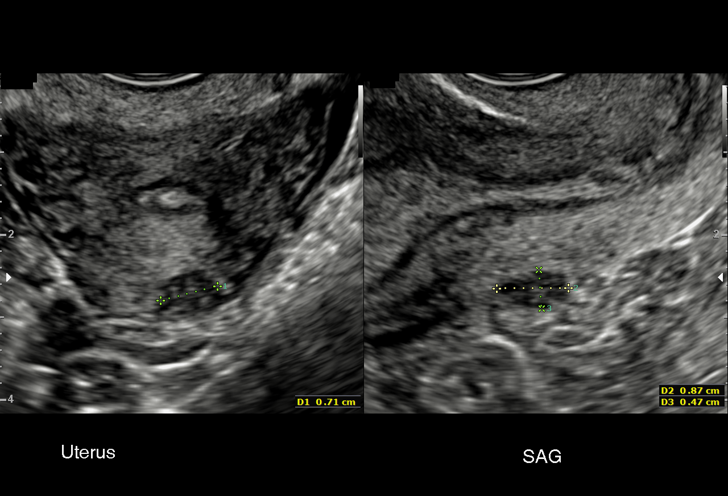
[im 41/53]
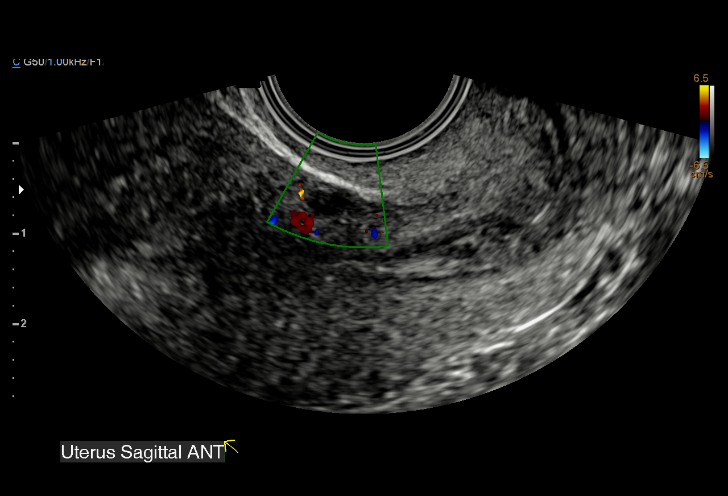
[im 45/53]
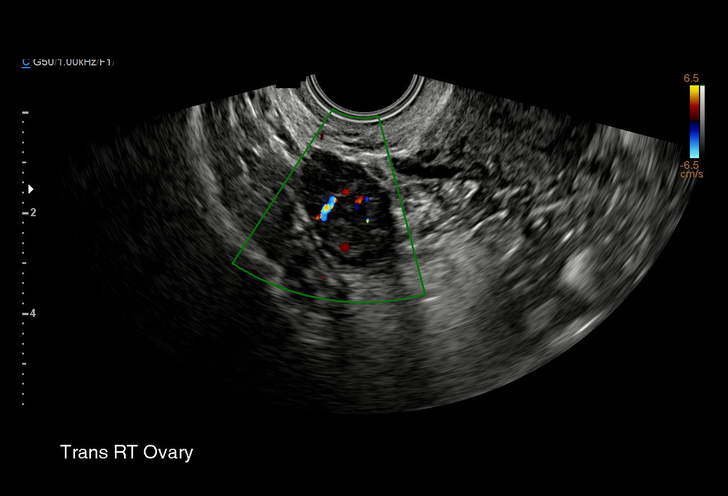
[im 49/53]
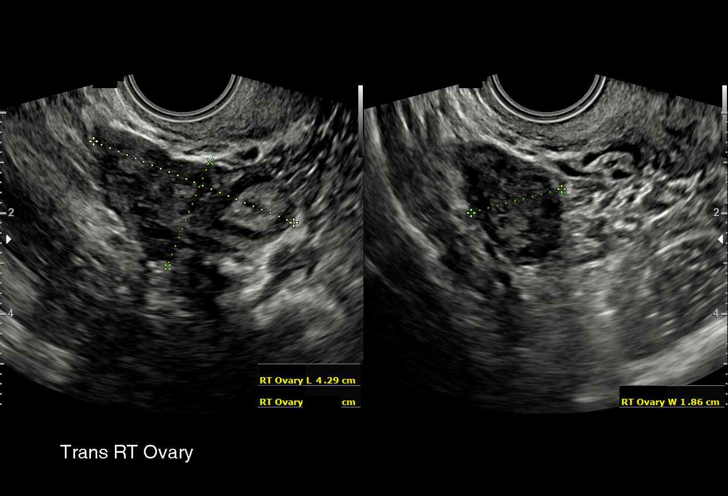
[im 53/53]
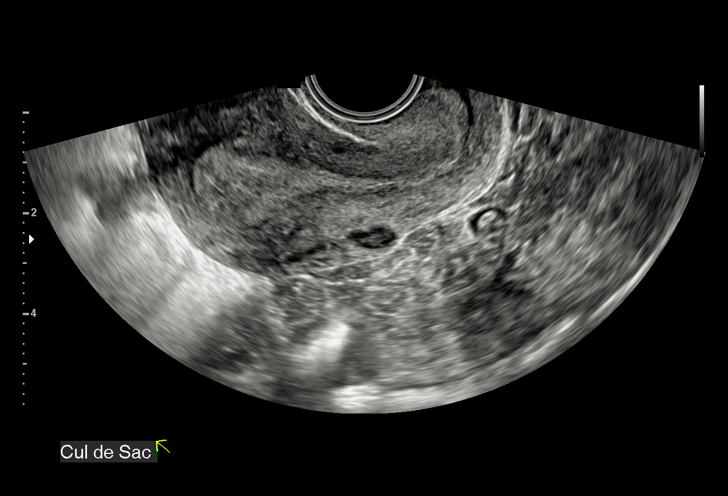

[15 of 28 positions shown; findings below may reference images not displayed]

FINDINGS: Intrauterine gestational sac: Single

Yolk sac:  Not Visualized.

Embryo:  Not Visualized.

Cardiac Activity:

Heart Rate:   bpm

MSD: 3.9 mm   5 w   0 d

CRL:    mm    w    d                  US EDC:

Subchorionic hemorrhage:  None visualized.

Maternal uterus/adnexae: Normal ovaries. Trace fluid in the
endocervical canal.
IMPRESSION: 1. A small rounded fluid collection in the endometrium is likely an
early gestational sac. No yolk sac or fetal pole is seen at this
time however. Recommend close clinical and imaging follow-up.
2. A small amount of fluid in the endometrial canal may represent a
small amount of blood given history.

## 2019-05-31 LAB — OB RESULTS CONSOLE GC/CHLAMYDIA
Chlamydia: NEGATIVE
Gonorrhea: NEGATIVE

## 2019-05-31 LAB — OB RESULTS CONSOLE HEPATITIS B SURFACE ANTIGEN: Hepatitis B Surface Ag: NEGATIVE

## 2019-05-31 LAB — OB RESULTS CONSOLE ANTIBODY SCREEN: Antibody Screen: NEGATIVE

## 2019-05-31 LAB — OB RESULTS CONSOLE RUBELLA ANTIBODY, IGM: Rubella: IMMUNE

## 2019-05-31 LAB — OB RESULTS CONSOLE HIV ANTIBODY (ROUTINE TESTING): HIV: NONREACTIVE

## 2019-05-31 LAB — OB RESULTS CONSOLE ABO/RH: RH Type: POSITIVE

## 2019-05-31 LAB — OB RESULTS CONSOLE RPR: RPR: NONREACTIVE

## 2019-06-27 ENCOUNTER — Ambulatory Visit (HOSPITAL_COMMUNITY): Payer: Medicaid Other

## 2019-06-27 ENCOUNTER — Encounter (HOSPITAL_COMMUNITY): Payer: Self-pay

## 2019-07-11 IMAGING — US US OB COMP LESS 14 WK
1 series · 15 of 25 positions shown · non-contrast
Comparison: 06/28/2017

CLINICAL DATA: Pregnancy

EXAM:
OBSTETRIC <14 WK ULTRASOUND
TECHNIQUE: Transabdominal ultrasound was performed for evaluation of the
gestation as well as the maternal uterus and adnexal regions.

[Series 1: us ob comp less 14 wk · 15 of 25 slices shown]
[im 1/25]
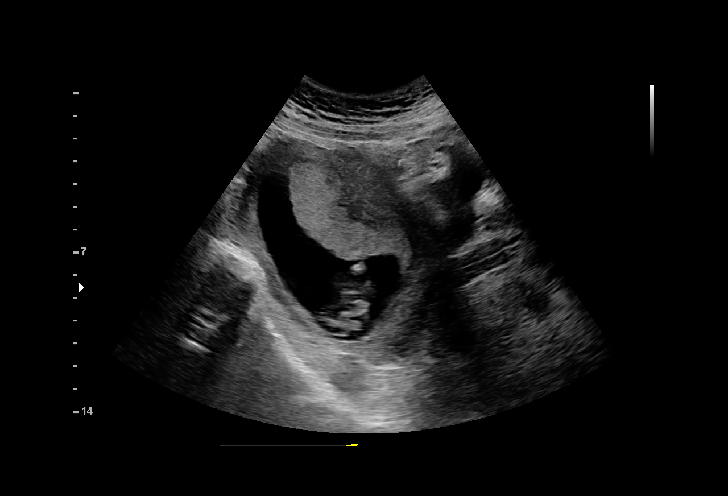
[im 3/25]
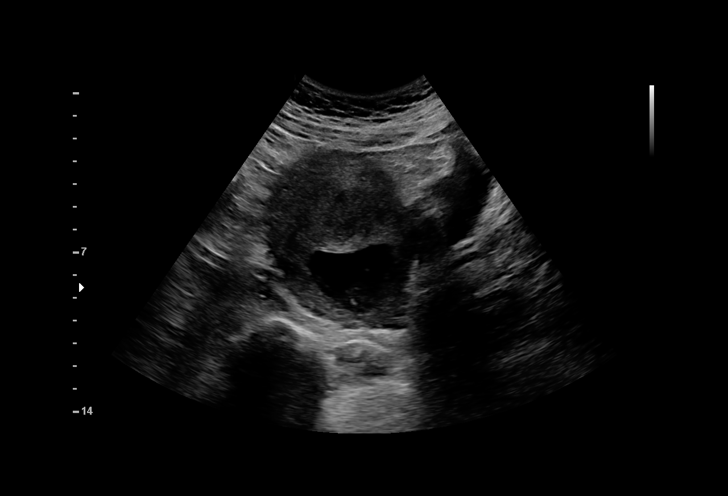
[im 5/25]
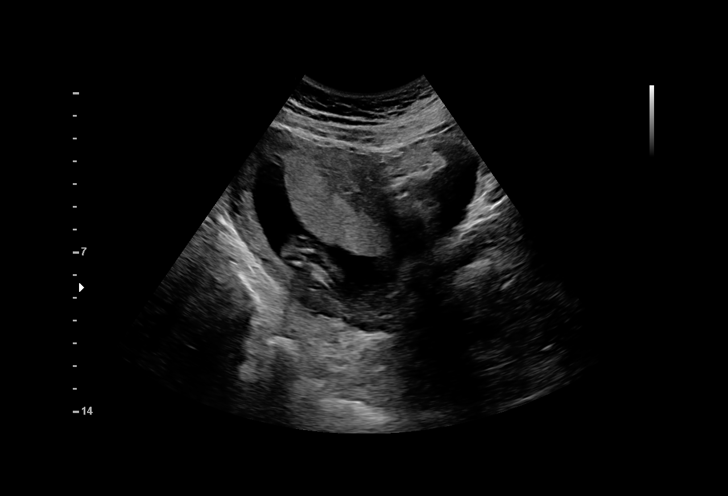
[im 6/25]
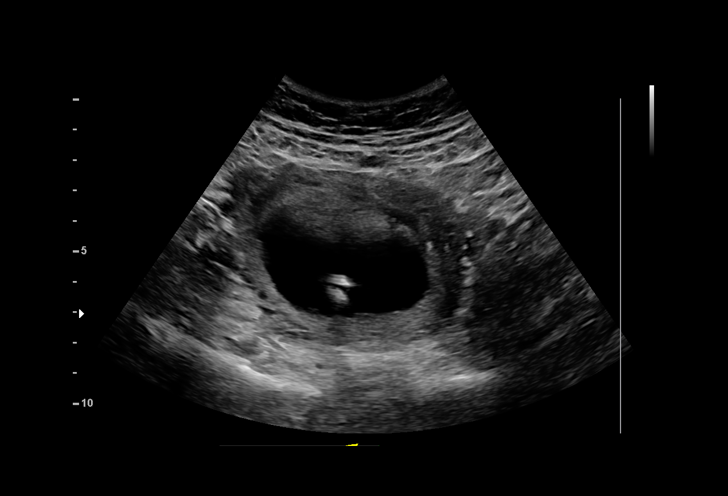
[im 8/25]
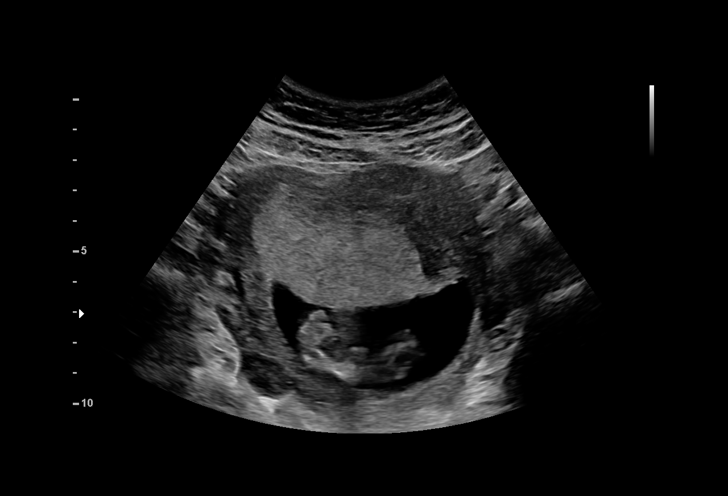
[im 10/25]
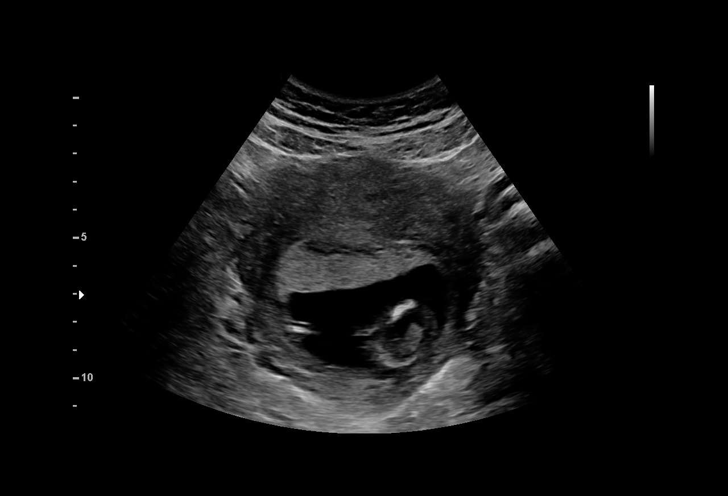
[im 11/25]
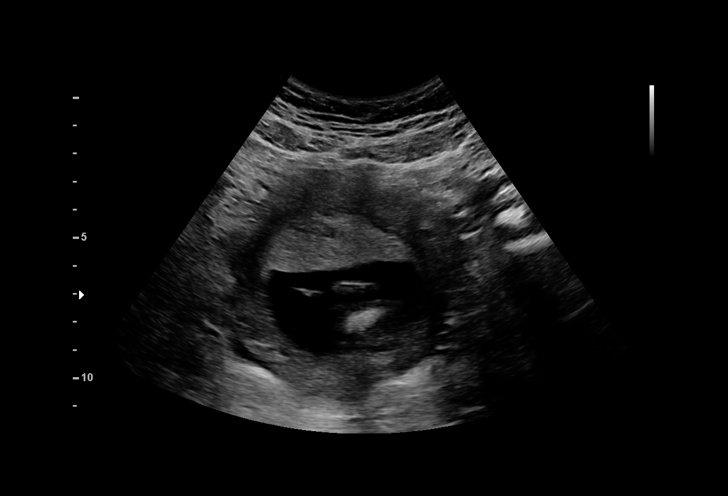
[im 13/25]
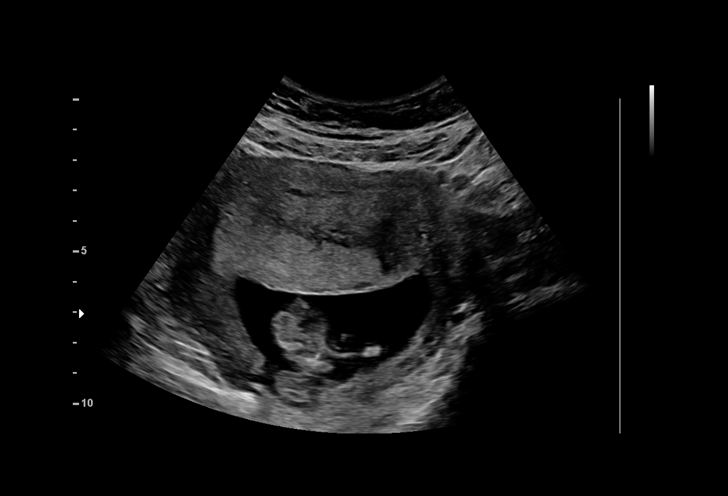
[im 15/25]
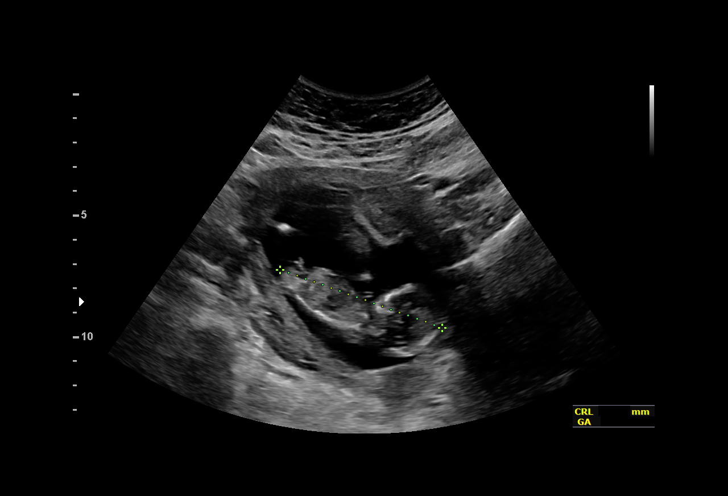
[im 16/25]
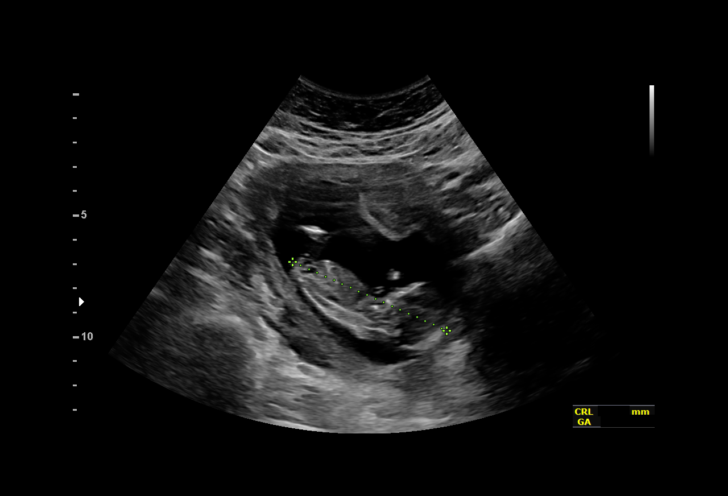
[im 18/25]
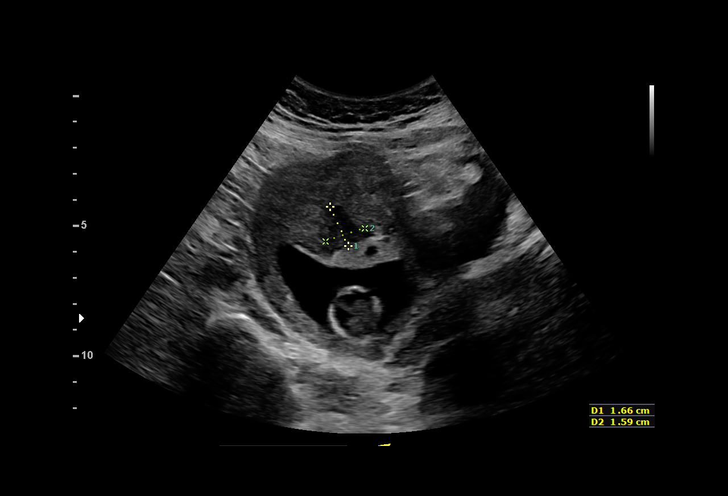
[im 20/25]
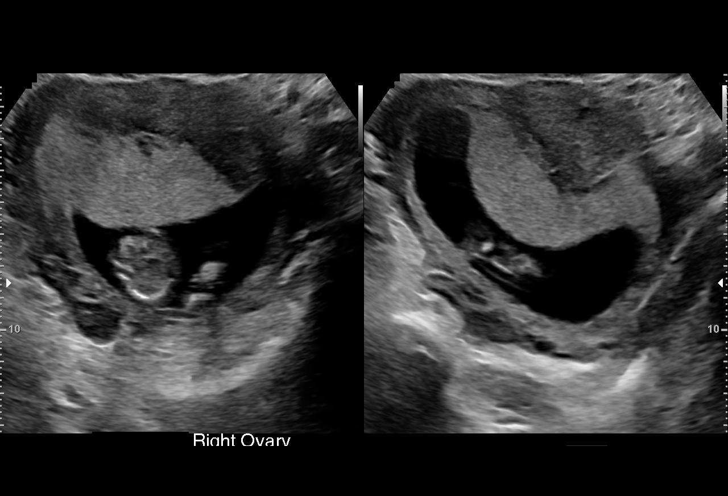
[im 21/25]
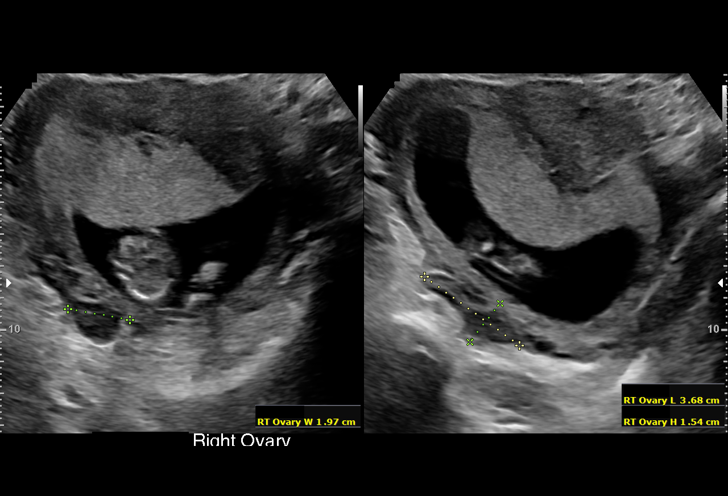
[im 23/25]
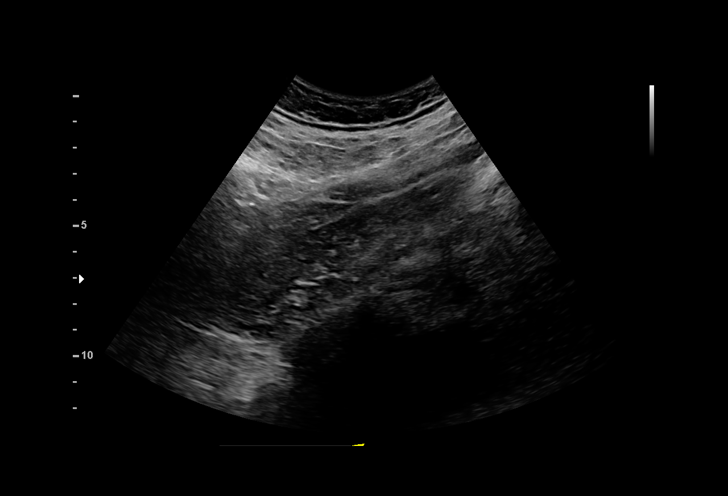
[im 25/25]
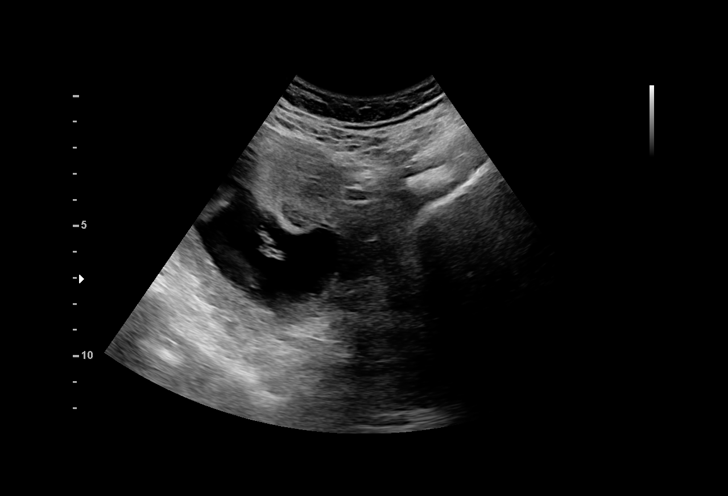

[15 of 25 positions shown; findings below may reference images not displayed]

FINDINGS: Intrauterine gestational sac: Single

Yolk sac:  Visualized

Embryo:  Visualized

Cardiac Activity: Visualized

Heart Rate: 148 bpm

MSD:  mm    w   d

CRL:   70.01 mm   13 w 1 d                  US EDC: 02/10/2018

Subchorionic hemorrhage:  Small subchorionic hemorrhage

Maternal uterus/adnexae: No adnexal mass or free fluid.
IMPRESSION: Thirteen week 1 day intrauterine pregnancy. Fetal heart rate 140
beats per minute. Small subchorionic hemorrhage.

## 2019-08-04 IMAGING — US US MFM OB LIMITED
1 series · 15 of 27 positions shown · non-contrast
Comparison: none

[Series 1: us mfm ob limited · 15 of 27 slices shown]
[im 1/27]
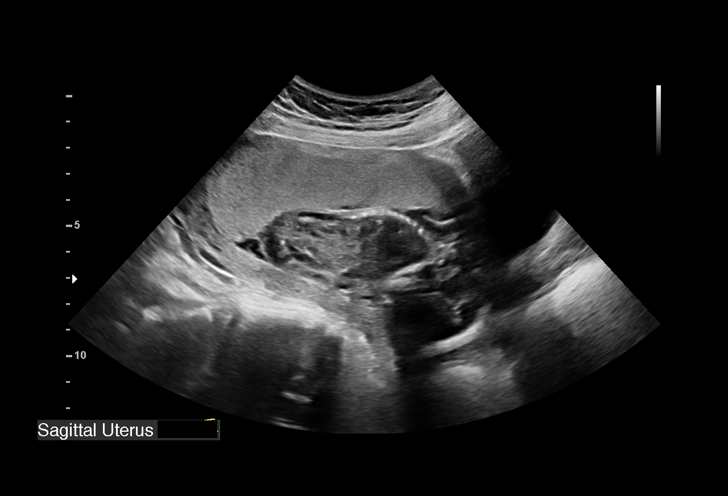
[im 3/27]
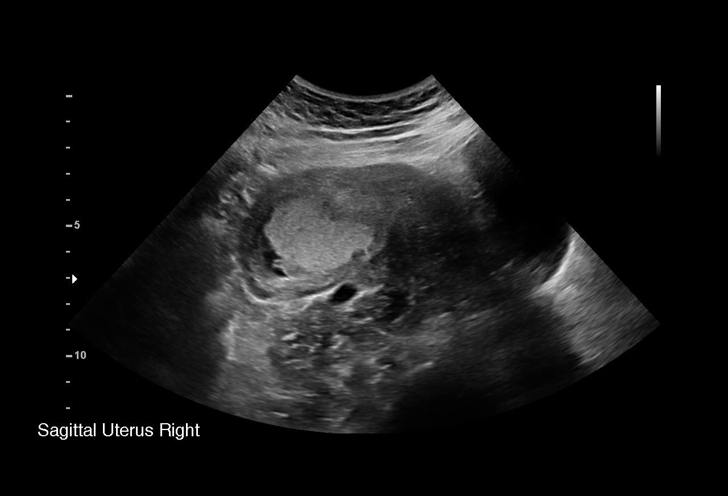
[im 5/27]
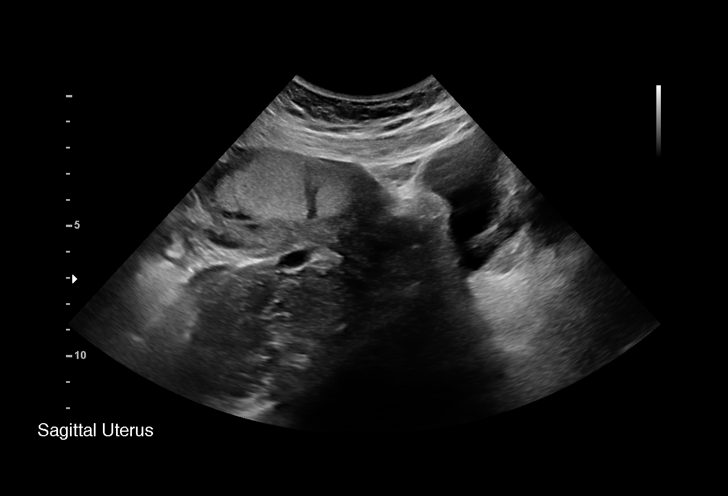
[im 7/27]
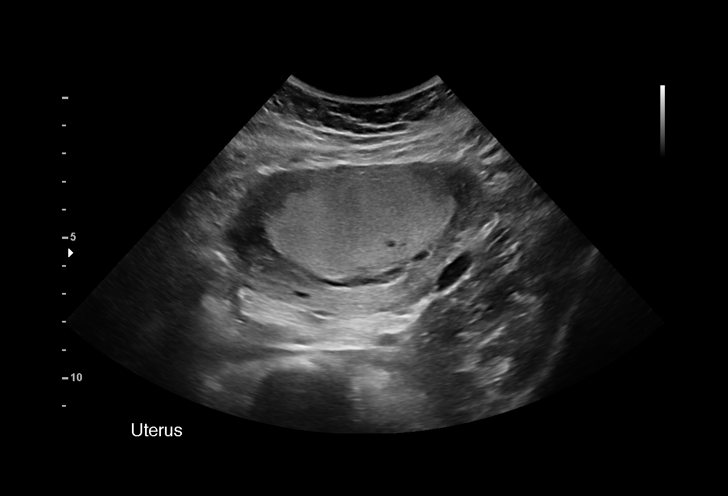
[im 9/27]
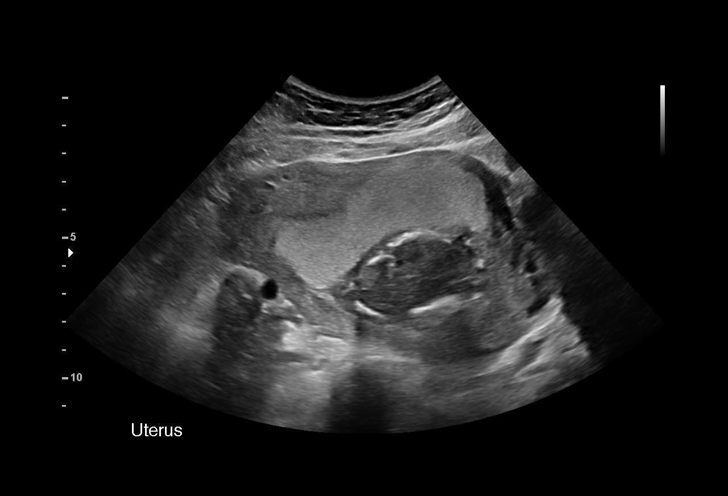
[im 10/27]
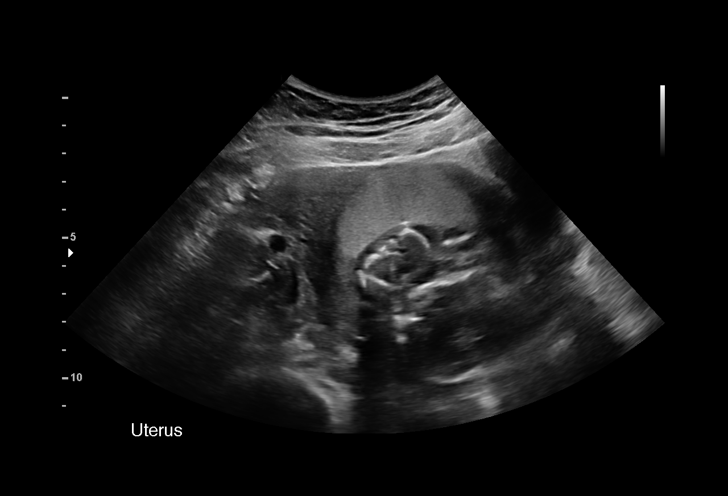
[im 12/27]
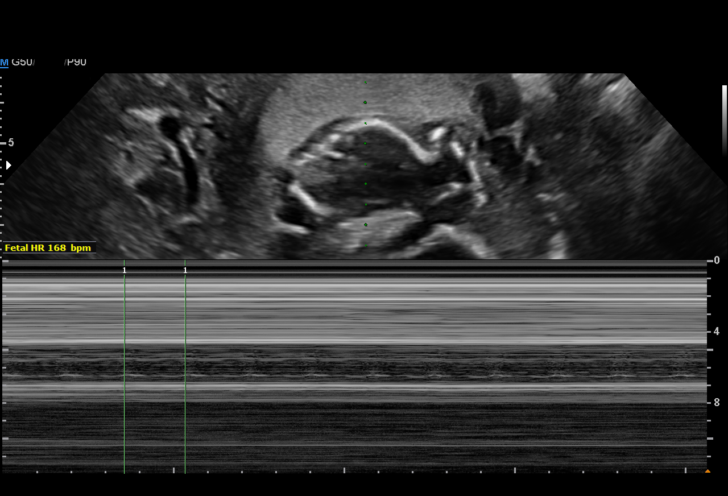
[im 14/27]
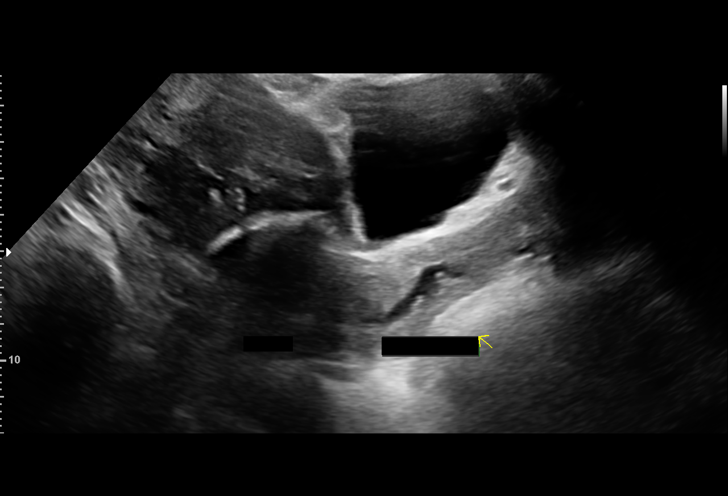
[im 16/27]
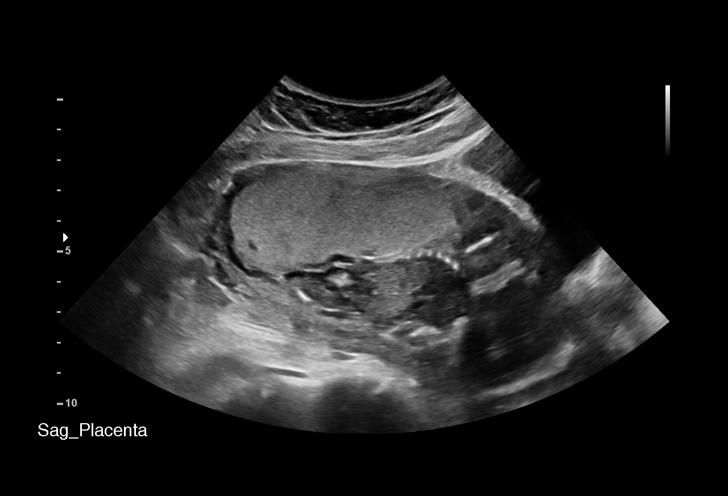
[im 18/27]
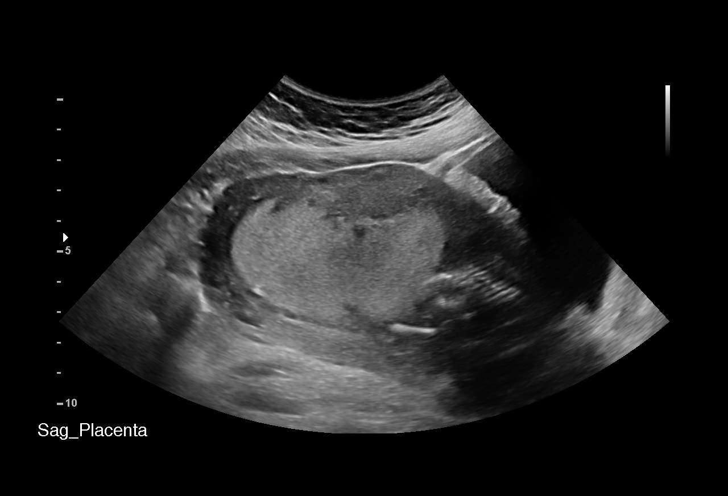
[im 19/27]
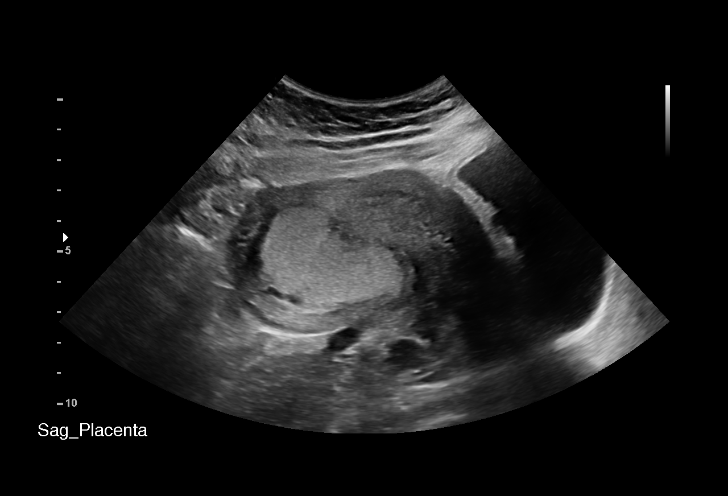
[im 21/27]
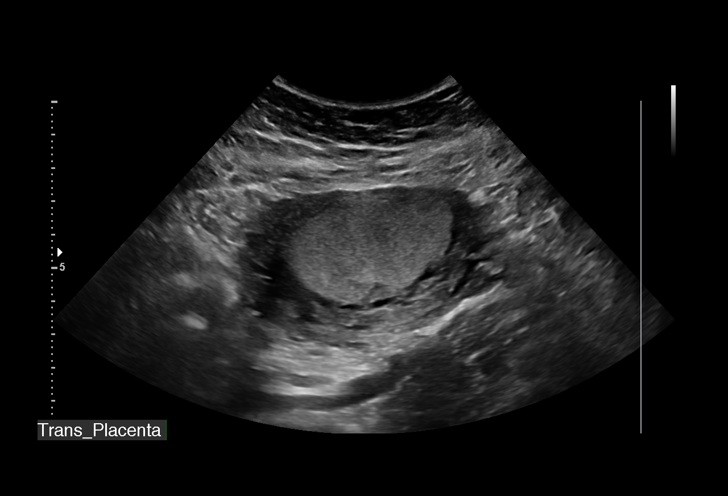
[im 23/27]
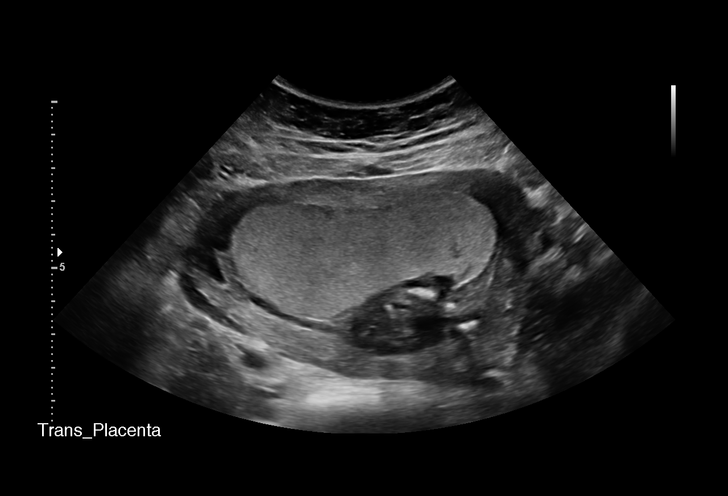
[im 25/27]
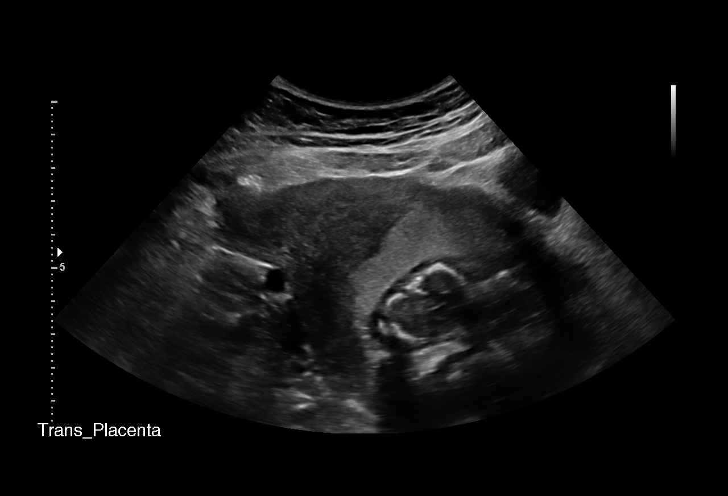
[im 27/27]
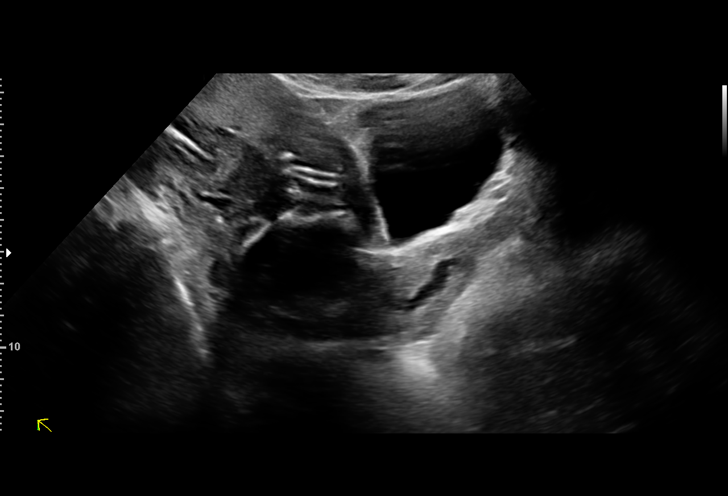

[15 of 27 positions shown; findings below may reference images not displayed]

[address]  LIZY [address]           [id number]      [id number]     [id number]
Indications

16 weeks gestation of pregnancy
Vaginal bleeding in pregnancy, second
trimester
OB History

Blood Type:            Height:  4'11"  Weight (lb):  114       BMI:
Gravidity:    1
Fetal Evaluation

Num Of Fetuses:     1
Fetal Heart         168
Rate(bpm):
Cardiac Activity:   Observed
Presentation:       Cephalic
Placenta:           Anterior, above cervical os

Amniotic Fluid
AFI FV:      Oligohydramnios
Gestational Age

Best:          16w 1d     Det. By:  Early Ultrasound         EDD:   02/13/18
(06/28/17)
Cervix Uterus Adnexa

Cervix
See note

Uterus
No abnormality visualized.
Left Ovary
Not visualized.

Right Ovary
Not visualized.

Adnexa:       No abnormality visualized.

Comment:      Fetal head appears to be within cervix.
Provider noted cord in vagina on pelvic exam,
although unable to appreciate on ultrasound.
Impression

Singleton intrauterine pregnancy at 16+1 weeks with vaginal
bleeding here for requested limited scan
Cephalic presentation
Normal fetal cardiac activity noted
Amniotic fluid volume is very low
The cervix is dilated with the fetal head within the cervical
canal; the prolapsed cord is not visualized
Recommendations

Continue clinical evaluation and management.

## 2019-11-02 DIAGNOSIS — Z419 Encounter for procedure for purposes other than remedying health state, unspecified: Secondary | ICD-10-CM | POA: Diagnosis not present

## 2019-11-15 DIAGNOSIS — O99343 Other mental disorders complicating pregnancy, third trimester: Secondary | ICD-10-CM | POA: Diagnosis not present

## 2019-11-15 DIAGNOSIS — O209 Hemorrhage in early pregnancy, unspecified: Secondary | ICD-10-CM | POA: Diagnosis not present

## 2019-11-15 DIAGNOSIS — Z8759 Personal history of other complications of pregnancy, childbirth and the puerperium: Secondary | ICD-10-CM | POA: Diagnosis not present

## 2019-11-15 DIAGNOSIS — Z3A35 35 weeks gestation of pregnancy: Secondary | ICD-10-CM | POA: Diagnosis not present

## 2019-11-15 DIAGNOSIS — Z3685 Encounter for antenatal screening for Streptococcus B: Secondary | ICD-10-CM | POA: Diagnosis not present

## 2019-11-15 DIAGNOSIS — D582 Other hemoglobinopathies: Secondary | ICD-10-CM | POA: Diagnosis not present

## 2019-11-15 LAB — OB RESULTS CONSOLE GBS: GBS: NEGATIVE

## 2019-11-30 DIAGNOSIS — Z8759 Personal history of other complications of pregnancy, childbirth and the puerperium: Secondary | ICD-10-CM | POA: Diagnosis not present

## 2019-11-30 DIAGNOSIS — Z3A37 37 weeks gestation of pregnancy: Secondary | ICD-10-CM | POA: Diagnosis not present

## 2019-12-02 ENCOUNTER — Other Ambulatory Visit: Payer: Self-pay | Admitting: Obstetrics and Gynecology

## 2019-12-02 NOTE — H&P (Unsigned)
Rita Acosta is a 29 y.o. female G2P0010 at 39+ for IOL given term and favorable.  Pregnancy complicated by history of 16wk fetal loss, VB in first trimester, h/o PCOS (metformin use to conceive), depression and Hgb E.  D/W pt r/b/a of IOL and process.    OB History    Gravida  1   Para  1   Term      Preterm      AB      Living        SAB      TAB      Ectopic      Multiple  0   Live Births            G1 16 wk loss, G2 present  No abn pap No STD Past Medical History:  Diagnosis Date  . Anemia   . Infection    pneumonia as infant and malaria infection at 61 years old  . Preterm premature rupture of membranes (PPROM) with unknown onset of labor 08/31/2017   Past Surgical History:  Procedure Laterality Date  . NO PAST SURGERIES     Family History: family history includes Diabetes in her father; Hypertension in her mother. Social History:  reports that she has never smoked. She has never used smokeless tobacco. She reports that she does not drink alcohol and does not use drugs. married, homemaker  Meds Protonix, PNV All NKDA     Maternal Diabetes: No Genetic Screening: Normal Maternal Ultrasounds/Referrals: Normal Fetal Ultrasounds or other Referrals:  None Maternal Substance Abuse:  No Significant Maternal Medications:  None Significant Maternal Lab Results:  Group B Strep negative Other Comments:  Hgb E  Review of Systems  Constitutional: Negative.   HENT: Negative.   Respiratory: Negative.   Cardiovascular: Negative.   Gastrointestinal: Negative.   Genitourinary: Negative.   Musculoskeletal: Negative.   Skin: Negative.   Neurological: Negative.   Psychiatric/Behavioral: Negative.    Maternal Medical History:  Contractions: Frequency: irregular.    Fetal activity: Perceived fetal activity is normal.    Prenatal complications: H/o 16wk loss, VB in early pregnancy, Hgb E, depression, h/o PCOS ( was on metformin)  Prenatal Complications -  Diabetes: none.      There were no vitals taken for this visit. Maternal Exam:  Abdomen: Patient reports no abdominal tenderness. Fundal height is appropriate for gestation.   Estimated fetal weight is 7-8#.   Fetal presentation: vertex  Introitus: Normal vulva. Normal vagina.    Physical Exam Constitutional:      Appearance: Normal appearance.  HENT:     Head: Normocephalic and atraumatic.  Cardiovascular:     Rate and Rhythm: Normal rate and regular rhythm.  Pulmonary:     Effort: Pulmonary effort is normal.     Breath sounds: Normal breath sounds.  Abdominal:     General: Bowel sounds are normal.     Palpations: Abdomen is soft.     Comments: GRAVID  Genitourinary:    General: Normal vulva.  Musculoskeletal:        General: Normal range of motion.     Cervical back: Normal range of motion and neck supple.  Skin:    General: Skin is warm and dry.  Neurological:     General: No focal deficit present.     Mental Status: She is alert and oriented to person, place, and time.  Psychiatric:        Mood and Affect: Mood normal.  Behavior: Behavior normal.     Prenatal labs: ABO, Rh:  B+ Antibody:  neg Rubella:  immune RPR:   NR HBsAg:   neg HIV:   neg GBS:   neg  Hgb 12.2/Plt 327/Ur Cx neg/GC neg/Chl neg/Varicella immune/Hgb E/HCV neg/glucola 126/Panorama low risk, female/essential panel neg (CF, SMA, fragile X neg)  [place] nl anat, ant plac, female Tdap 5/14  Assessment/Plan: 29yo G2P0 at 39+ for IOL given term and favorable IOL with AROM, ptiocin Epidural, IV pain meds or nitrous PRN Expect SVD   Sheika Coutts Bovard-Stuckert 12/02/2019, 5:48 PM

## 2019-12-03 DIAGNOSIS — Z419 Encounter for procedure for purposes other than remedying health state, unspecified: Secondary | ICD-10-CM | POA: Diagnosis not present

## 2019-12-04 ENCOUNTER — Telehealth (HOSPITAL_COMMUNITY): Payer: Self-pay | Admitting: *Deleted

## 2019-12-04 ENCOUNTER — Encounter (HOSPITAL_COMMUNITY): Payer: Self-pay | Admitting: *Deleted

## 2019-12-04 NOTE — Telephone Encounter (Signed)
Interpreter number [phone]

## 2019-12-04 NOTE — Telephone Encounter (Signed)
Preadmission screen  

## 2019-12-07 DIAGNOSIS — Z3A38 38 weeks gestation of pregnancy: Secondary | ICD-10-CM | POA: Diagnosis not present

## 2019-12-07 DIAGNOSIS — Z8759 Personal history of other complications of pregnancy, childbirth and the puerperium: Secondary | ICD-10-CM | POA: Diagnosis not present

## 2019-12-09 ENCOUNTER — Other Ambulatory Visit (HOSPITAL_COMMUNITY)
Admission: RE | Admit: 2019-12-09 | Discharge: 2019-12-09 | Disposition: A | Discharge status: Home / Self Care | Payer: Medicaid Other | Source: Ambulatory Visit | Attending: Obstetrics and Gynecology | Admitting: Obstetrics and Gynecology

## 2019-12-09 DIAGNOSIS — Z01812 Encounter for preprocedural laboratory examination: Secondary | ICD-10-CM | POA: Diagnosis not present

## 2019-12-09 DIAGNOSIS — Z20822 Contact with and (suspected) exposure to covid-19: Secondary | ICD-10-CM | POA: Diagnosis not present

## 2019-12-09 LAB — SARS CORONAVIRUS 2 (TAT 6-24 HRS): SARS Coronavirus 2: NEGATIVE

## 2019-12-11 ENCOUNTER — Inpatient Hospital Stay (HOSPITAL_COMMUNITY): Payer: Medicaid Other

## 2019-12-11 ENCOUNTER — Other Ambulatory Visit: Payer: Self-pay | Admitting: Obstetrics and Gynecology

## 2019-12-11 ENCOUNTER — Inpatient Hospital Stay (HOSPITAL_COMMUNITY): Payer: Medicaid Other | Admitting: Anesthesiology

## 2019-12-11 ENCOUNTER — Inpatient Hospital Stay (HOSPITAL_COMMUNITY)
Admission: RE | Admit: 2019-12-11 | Discharge: 2019-12-13 | DRG: 768 | Disposition: A | Discharge status: Home / Self Care | Payer: Medicaid Other | Attending: Obstetrics and Gynecology | Admitting: Obstetrics and Gynecology

## 2019-12-11 ENCOUNTER — Encounter (HOSPITAL_COMMUNITY): Payer: Self-pay | Admitting: Obstetrics and Gynecology

## 2019-12-11 ENCOUNTER — Other Ambulatory Visit: Payer: Self-pay

## 2019-12-11 DIAGNOSIS — Z3493 Encounter for supervision of normal pregnancy, unspecified, third trimester: Secondary | ICD-10-CM

## 2019-12-11 DIAGNOSIS — O9081 Anemia of the puerperium: Secondary | ICD-10-CM | POA: Diagnosis not present

## 2019-12-11 DIAGNOSIS — Z3A39 39 weeks gestation of pregnancy: Secondary | ICD-10-CM | POA: Diagnosis not present

## 2019-12-11 DIAGNOSIS — O26893 Other specified pregnancy related conditions, third trimester: Secondary | ICD-10-CM | POA: Diagnosis not present

## 2019-12-11 LAB — CBC
HCT: 35.5 % — ABNORMAL LOW (ref 36.0–46.0)
Hemoglobin: 11.7 g/dL — ABNORMAL LOW (ref 12.0–15.0)
MCH: 27 pg (ref 26.0–34.0)
MCHC: 33 g/dL (ref 30.0–36.0)
MCV: 82 fL (ref 80.0–100.0)
Platelets: 196 10*3/uL (ref 150–400)
RBC: 4.33 MIL/uL (ref 3.87–5.11)
RDW: 14.3 % (ref 11.5–15.5)
WBC: 9.6 10*3/uL (ref 4.0–10.5)
nRBC: 0 % (ref 0.0–0.2)

## 2019-12-11 LAB — TYPE AND SCREEN
ABO/RH(D): B POS
Antibody Screen: NEGATIVE

## 2019-12-11 LAB — RPR: RPR Ser Ql: NONREACTIVE

## 2019-12-11 MED ORDER — PRENATAL MULTIVITAMIN CH
1.0000 | ORAL_TABLET | Freq: Every day | ORAL | Status: DC
  Administered 2019-12-12 – 2019-12-13 (×2): 1 via ORAL
  Filled 2019-12-11 (×2): qty 1

## 2019-12-11 MED ORDER — SOD CITRATE-CITRIC ACID 500-334 MG/5ML PO SOLN: 30.0000 mL | ORAL | Status: DC | PRN

## 2019-12-11 MED ORDER — CONCEPT OB 130-92.4-1 MG PO CAPS: 1.0000 | ORAL_CAPSULE | Freq: Every day | ORAL | Status: DC

## 2019-12-11 MED ORDER — PHENYLEPHRINE 40 MCG/ML (10ML) SYRINGE FOR IV PUSH (FOR BLOOD PRESSURE SUPPORT): 80.0000 ug | PREFILLED_SYRINGE | INTRAVENOUS | Status: DC | PRN

## 2019-12-11 MED ORDER — EPHEDRINE 5 MG/ML INJ
10.0000 mg | INTRAVENOUS | Status: DC | PRN
  Filled 2019-12-11: qty 10

## 2019-12-11 MED ORDER — EPHEDRINE 5 MG/ML INJ
10.0000 mg | INTRAVENOUS | Status: AC | PRN
  Administered 2019-12-11 (×2): 10 mg via INTRAVENOUS

## 2019-12-11 MED ORDER — ACETAMINOPHEN 325 MG PO TABS
650.0000 mg | ORAL_TABLET | ORAL | Status: DC | PRN
  Administered 2019-12-11: 650 mg via ORAL
  Filled 2019-12-11: qty 2

## 2019-12-11 MED ORDER — TERBUTALINE SULFATE 1 MG/ML IJ SOLN: 0.2500 mg | Freq: Once | INTRAMUSCULAR | Status: DC | PRN

## 2019-12-11 MED ORDER — ONDANSETRON HCL 4 MG/2ML IJ SOLN: 4.0000 mg | Freq: Four times a day (QID) | INTRAMUSCULAR | Status: DC | PRN

## 2019-12-11 MED ORDER — OXYTOCIN BOLUS FROM INFUSION
333.0000 mL | Freq: Once | INTRAVENOUS | Status: AC
  Administered 2019-12-11: 333 mL via INTRAVENOUS

## 2019-12-11 MED ORDER — LACTATED RINGERS IV SOLN: 500.0000 mL | Freq: Once | INTRAVENOUS | Status: DC

## 2019-12-11 MED ORDER — OXYCODONE-ACETAMINOPHEN 5-325 MG PO TABS: 2.0000 | ORAL_TABLET | ORAL | Status: DC | PRN

## 2019-12-11 MED ORDER — OXYCODONE-ACETAMINOPHEN 5-325 MG PO TABS: 1.0000 | ORAL_TABLET | ORAL | Status: DC | PRN

## 2019-12-11 MED ORDER — BUTORPHANOL TARTRATE 1 MG/ML IJ SOLN: 1.0000 mg | INTRAMUSCULAR | Status: DC | PRN

## 2019-12-11 MED ORDER — FENTANYL-BUPIVACAINE-NACL 0.5-0.125-0.9 MG/250ML-% EP SOLN
12.0000 mL/h | EPIDURAL | Status: DC | PRN
  Filled 2019-12-11: qty 250

## 2019-12-11 MED ORDER — TETANUS-DIPHTH-ACELL PERTUSSIS 5-2.5-18.5 LF-MCG/0.5 IM SUSP: 0.5000 mL | Freq: Once | INTRAMUSCULAR | Status: DC

## 2019-12-11 MED ORDER — LACTATED RINGERS IV SOLN
500.0000 mL | INTRAVENOUS | Status: DC | PRN
  Administered 2019-12-11: 500 mL via INTRAVENOUS

## 2019-12-11 MED ORDER — BENZOCAINE-MENTHOL 20-0.5 % EX AERO
1.0000 "application " | INHALATION_SPRAY | CUTANEOUS | Status: DC | PRN
  Filled 2019-12-11: qty 56

## 2019-12-11 MED ORDER — OXYTOCIN-SODIUM CHLORIDE 30-0.9 UT/500ML-% IV SOLN: 2.5000 [IU]/h | INTRAVENOUS | Status: DC

## 2019-12-11 MED ORDER — LACTATED RINGERS IV SOLN: INTRAVENOUS | Status: DC

## 2019-12-11 MED ORDER — ACETAMINOPHEN 325 MG PO TABS: 650.0000 mg | ORAL_TABLET | ORAL | Status: DC | PRN

## 2019-12-11 MED ORDER — COCONUT OIL OIL: 1.0000 "application " | TOPICAL_OIL | Status: DC | PRN

## 2019-12-11 MED ORDER — OXYTOCIN-SODIUM CHLORIDE 30-0.9 UT/500ML-% IV SOLN
1.0000 m[IU]/min | INTRAVENOUS | Status: DC
  Administered 2019-12-11: 2 m[IU]/min via INTRAVENOUS
  Filled 2019-12-11: qty 500

## 2019-12-11 MED ORDER — OXYCODONE HCL 5 MG PO TABS: 5.0000 mg | ORAL_TABLET | ORAL | Status: DC | PRN

## 2019-12-11 MED ORDER — IBUPROFEN 600 MG PO TABS
600.0000 mg | ORAL_TABLET | Freq: Four times a day (QID) | ORAL | Status: DC
  Administered 2019-12-11 – 2019-12-13 (×8): 600 mg via ORAL
  Filled 2019-12-11 (×8): qty 1

## 2019-12-11 MED ORDER — ZOLPIDEM TARTRATE 5 MG PO TABS: 5.0000 mg | ORAL_TABLET | Freq: Every evening | ORAL | Status: DC | PRN

## 2019-12-11 MED ORDER — ONDANSETRON HCL 4 MG/2ML IJ SOLN: 4.0000 mg | INTRAMUSCULAR | Status: DC | PRN

## 2019-12-11 MED ORDER — SIMETHICONE 80 MG PO CHEW: 80.0000 mg | CHEWABLE_TABLET | ORAL | Status: DC | PRN

## 2019-12-11 MED ORDER — LIDOCAINE HCL (PF) 1 % IJ SOLN
30.0000 mL | INTRAMUSCULAR | Status: AC | PRN
  Administered 2019-12-11: 30 mL via SUBCUTANEOUS
  Filled 2019-12-11: qty 30

## 2019-12-11 MED ORDER — DIPHENHYDRAMINE HCL 25 MG PO CAPS: 25.0000 mg | ORAL_CAPSULE | Freq: Four times a day (QID) | ORAL | Status: DC | PRN

## 2019-12-11 MED ORDER — LIDOCAINE HCL (PF) 1 % IJ SOLN
INTRAMUSCULAR | Status: DC | PRN
  Administered 2019-12-11: 10 mL via EPIDURAL

## 2019-12-11 MED ORDER — ONDANSETRON HCL 4 MG PO TABS: 4.0000 mg | ORAL_TABLET | ORAL | Status: DC | PRN

## 2019-12-11 MED ORDER — WITCH HAZEL-GLYCERIN EX PADS: 1.0000 "application " | MEDICATED_PAD | CUTANEOUS | Status: DC | PRN

## 2019-12-11 MED ORDER — SODIUM CHLORIDE (PF) 0.9 % IJ SOLN
INTRAMUSCULAR | Status: DC | PRN
  Administered 2019-12-11: 12 mL/h via EPIDURAL

## 2019-12-11 MED ORDER — OXYCODONE HCL 5 MG PO TABS: 10.0000 mg | ORAL_TABLET | ORAL | Status: DC | PRN

## 2019-12-11 MED ORDER — SENNOSIDES-DOCUSATE SODIUM 8.6-50 MG PO TABS
2.0000 | ORAL_TABLET | ORAL | Status: DC
  Administered 2019-12-11 – 2019-12-12 (×2): 2 via ORAL
  Filled 2019-12-11 (×2): qty 2

## 2019-12-11 MED ORDER — DIBUCAINE (PERIANAL) 1 % EX OINT: 1.0000 "application " | TOPICAL_OINTMENT | CUTANEOUS | Status: DC | PRN

## 2019-12-11 MED ORDER — DIPHENHYDRAMINE HCL 50 MG/ML IJ SOLN: 12.5000 mg | INTRAMUSCULAR | Status: DC | PRN

## 2019-12-11 NOTE — Anesthesia Procedure Notes (Signed)
Epidural Patient location during procedure: OB Start time: 12/11/2019 10:20 AM End time: 12/11/2019 10:30 AM  Staffing Anesthesiologist: Lucretia Kern, MD Performed: anesthesiologist   Preanesthetic Checklist Completed: patient identified, IV checked, risks and benefits discussed, monitors and equipment checked, pre-op evaluation and timeout performed  Epidural Patient position: sitting Prep: DuraPrep Patient monitoring: heart rate, continuous pulse ox and blood pressure Approach: midline Location: L3-L4 Injection technique: LOR air  Needle:  Needle type: Tuohy  Needle gauge: 17 G Needle length: 9 cm Needle insertion depth: 5 cm Catheter type: closed end flexible Catheter size: 19 Gauge Catheter at skin depth: 10 cm Test dose: negative  Assessment Events: blood not aspirated, injection not painful, no injection resistance, no paresthesia and negative IV test  Additional Notes Reason for block:procedure for pain

## 2019-12-11 NOTE — Lactation Note (Signed)
This note was copied from a baby's chart. Lactation Consultation Note  Patient Name: Rita Acosta [id number] Date: 12/11/2019 Reason for consult: Initial assessment;Primapara;1st time breastfeeding;NICU baby;Term  LC in to visit with P1 Mom and FOB of term baby with TTN in NICU.   Baby 3 hrs old and on room air currently.  Mom resting in bed.  FOB interpreting, Mom understands a lot as well.  Mom taught breast massage and hand expression.  Unable to express any drops, but encouraged Mom to do this frequently.  Colostrum containers provided to collect colostrum to take to NICU.  Set up DEBP and assisted Mom to double pump for the first time.  Showed Mom and FOB the initiation setting to use.  24 mm flanges appear to be a good fit, but may need 21 mm.   Wrote on dry erase board for Mom to pump every 2-3 hrs during the daytime and 3-4 hrs at night, the best she can.   Parents taught to disassemble pump parts, wash, rinse and air dry in separate bin provided on counter.   FOB states they have a DEBP at home to use.  Parents also aware of pump in baby's room that they can use.  Hopefully baby can transition back to Mom's room on MBU.    Mom given lactation brochure and aware of IP and OP lactation support available to her.    Interventions Interventions: Breast feeding basics reviewed;Skin to skin;Breast massage;Hand express;DEBP  Lactation Tools Discussed/Used Tools: Pump Breast pump type: Double-Electric Breast Pump Pump Review: Setup, frequency, and cleaning;Milk Storage Initiated by:: Erby Pian RN IBCLC Date initiated:: 12/11/19   Consult Status Consult Status: Follow-up Date: 12/12/19 Follow-up type: In-patient    [name] 12/11/2019, 4:15 PM

## 2019-12-11 NOTE — Anesthesia Preprocedure Evaluation (Signed)

## 2019-12-11 NOTE — Anesthesia Postprocedure Evaluation (Signed)
Anesthesia Post Note  Patient: Hydrographic surveyor  Procedure(s) Performed: AN AD HOC LABOR EPIDURAL     Patient location during evaluation: Mother Baby Anesthesia Type: Epidural Level of consciousness: awake Pain management: satisfactory to patient Vital Signs Assessment: post-procedure vital signs reviewed and stable Respiratory status: spontaneous breathing Cardiovascular status: stable Anesthetic complications: no   No complications documented.  Last Vitals:  Vitals:   12/11/19 1500 12/11/19 1600  BP: 94/65 91/66  Pulse: 87 85  Resp: 18 18  Temp: 36.5 C 36.7 C  SpO2: 100%     Last Pain:  Vitals:   12/11/19 1746  TempSrc:   PainSc: 3    Pain Goal:                   [employer]

## 2019-12-11 NOTE — Progress Notes (Signed)
[employer] interpreter [phone] to be used for epidural

## 2019-12-11 NOTE — Progress Notes (Signed)
Patient ID: Rita Acosta, female   DOB: 1990/05/20, 29 y.o.   MRN: [id number]  H&P reviewed, no changes.  D/W pt AROM and pain control.    AFVSS gen NAD FHTs 130's mod var, + accels, category 1, some earlies toco irr  AROM for clear fluid SVE 4/90/0  Continue close monitoring, expect SVD

## 2019-12-11 NOTE — H&P (Signed)
Rita Acosta is a 29 y.o. female G2P0010 at 39+ for IOL given term and favorable.  Pregnancy complicated by history of 16wk fetal loss, VB in first trimester, h/o PCOS (metformin use to conceive), depression and Hgb E.  D/W pt r/b/a of IOL and process.    OB History    Gravida  2   Para  1   Term      Preterm      AB      Living        SAB      TAB      Ectopic      Multiple  0   Live Births            G1 16 wk loss, G2 present  No abn pap No STD Past Medical History:  Diagnosis Date  . Anemia   . Infection    pneumonia as infant and malaria infection at 40 years old  . Preterm premature rupture of membranes (PPROM) with unknown onset of labor 08/31/2017   Past Surgical History:  Procedure Laterality Date  . NO PAST SURGERIES     Family History: family history includes Diabetes in her father; Hypertension in her mother. Social History:  reports that she has never smoked. She has never used smokeless tobacco. She reports that she does not drink alcohol and does not use drugs. married, homemaker  Meds Protonix, PNV All NKDA     Maternal Diabetes: No Genetic Screening: Normal Maternal Ultrasounds/Referrals: Normal Fetal Ultrasounds or other Referrals:  None Maternal Substance Abuse:  No Significant Maternal Medications:  None Significant Maternal Lab Results:  Group B Strep negative Other Comments:  Hgb E  Review of Systems  Constitutional: Negative.   HENT: Negative.   Respiratory: Negative.   Cardiovascular: Negative.   Gastrointestinal: Negative.   Genitourinary: Negative.   Musculoskeletal: Negative.   Skin: Negative.   Neurological: Negative.   Psychiatric/Behavioral: Negative.    Maternal Medical History:  Contractions: Frequency: irregular.    Fetal activity: Perceived fetal activity is normal.    Prenatal complications: H/o 16wk loss, VB in early pregnancy, Hgb E, depression, h/o PCOS ( was on metformin)  Prenatal Complications -  Diabetes: none.      Blood pressure 111/72, pulse 96, temperature 97.9 F (36.6 C), temperature source Oral, resp. rate 17. Maternal Exam:  Abdomen: Patient reports no abdominal tenderness. Fundal height is appropriate for gestation.   Estimated fetal weight is 7-8#.   Fetal presentation: vertex  Introitus: Normal vulva. Normal vagina.    Physical Exam Constitutional:      Appearance: Normal appearance.  HENT:     Head: Normocephalic and atraumatic.  Cardiovascular:     Rate and Rhythm: Normal rate and regular rhythm.  Pulmonary:     Effort: Pulmonary effort is normal.     Breath sounds: Normal breath sounds.  Abdominal:     General: Bowel sounds are normal.     Palpations: Abdomen is soft.     Comments: GRAVID  Genitourinary:    General: Normal vulva.  Musculoskeletal:        General: Normal range of motion.     Cervical back: Normal range of motion and neck supple.  Skin:    General: Skin is warm and dry.  Neurological:     General: No focal deficit present.     Mental Status: She is alert and oriented to person, place, and time.  Psychiatric:  Mood and Affect: Mood normal.        Behavior: Behavior normal.     Prenatal labs: ABO, Rh: B/Positive/-- (01/27 0000)B+ Antibody: Negative (01/27 0000)neg Rubella: Immune (01/27 0000)immune RPR: Nonreactive (01/27 0000) NR HBsAg: Negative (01/27 0000) neg HIV: Non-reactive (01/27 0000) neg GBS: Negative/-- (07/14 0000) neg  Hgb 12.2/Plt 327/Ur Cx neg/GC neg/Chl neg/Varicella immune/Hgb E/HCV neg/glucola 126/Panorama low risk, female/essential panel neg (CF, SMA, fragile X neg)  [place] nl anat, ant plac, female Tdap 5/14  Assessment/Plan: 29yo G2P0 at 39+ for IOL given term and favorable IOL with AROM, ptiocin Epidural, IV pain meds or nitrous PRN Expect SVD   Masaji Billups Bovard-Stuckert 12/11/2019, 8:34 AM

## 2019-12-11 NOTE — Lactation Note (Signed)
This note was copied from a baby's chart.  Lactation Consultation Note  Patient Name: Rita Acosta Date: 12/11/2019 Reason for consult: Follow-up assessment;Mother's request;Difficult latch;Other (Comment) (Transitition from NICU to [facility] with mom.) P1, 9 hour term female infant. Parents current feeding choice is breast feeding and supplementing with formula. [facility] call [place] interpreter [phone] # [phone] mom declined interpreter services she prefers her husband  Infant transition from NICU to [facility] in mom's room.  LC reviewed hand expression and colostrum not currently present with expression mom with hx of PCOS on metformin in pregnancy.  LC discussed breast stimulation, hand expression, lots of STS and mom will use DEBP every 3 hours for 15 minutes on initial setting to help establish her milk supply. Mom requested LC services due infant refusing to latch at breast, infant was initially given formula in NICU using bottle nipple.  After mutiple attempts mom was fitted with 20 mm NS due infant refusing to latch, NS was pre-filled with 0.5 mls of Similac advance  with iron, mom latched infant on her left breast using the football hold position swallows observed. Infant sustained latch and BF for 15 minutes, afterwards infant was given 5 mls of formula on clean finger by dad with curve tip syringe.   Mom is working on latching infant at breast, mom understands NS is temporary and tomorrow she will attempt to latch infant without NS.  Mom had pumped with DEBP  prior to [facility] entering the room.  Mom knows to call RN or LC if she needs assistance with latching infant at breast.  Mom's  current infant  feeding plan within the first 24 hours: 1. Mom will  apply 20 mm NS prior to latching infant at breast and continue to BF by cues, on demand, 8 to 12+ times within 24 hours. 2.Dad will supplement infant with formula their choice after mom latches infant at breast according BF supplemental guidelines  day 1 ( 5-7 mls) per feeding.  3. While dad is supplementing infant with formula mom will use DEBP for 15 minutes on initial setting.   Maternal Data    Feeding Feeding Type: Breast Fed Nipple Type: Nfant Standard Flow (white)  LATCH Score Latch: Grasps breast easily, tongue down, lips flanged, rhythmical sucking.  Audible Swallowing: Spontaneous and intermittent  Type of Nipple: Everted at rest and after stimulation  Comfort (Breast/Nipple): Soft / non-tender  Hold (Positioning): Assistance needed to correctly position infant at breast and maintain latch.  LATCH Score: 9  Interventions Interventions: Breast feeding basics reviewed;Assisted with latch;Skin to skin;Breast massage;Hand express;Position options;DEBP;Adjust position;Breast compression;Support pillows  Lactation Tools Discussed/Used Tools: Nipple Shields Nipple shield size: 20 (infant given bottle initial in NICU breast refusal 20 mm NS used.)   Consult Status Consult Status: Follow-up Date: 12/12/19 Follow-up type: In-patient    Danelle Earthly 12/11/2019, 10:20 PM

## 2019-12-12 LAB — CBC
HCT: 26.1 % — ABNORMAL LOW (ref 36.0–46.0)
Hemoglobin: 8.7 g/dL — ABNORMAL LOW (ref 12.0–15.0)
MCH: 26.9 pg (ref 26.0–34.0)
MCHC: 33.3 g/dL (ref 30.0–36.0)
MCV: 80.8 fL (ref 80.0–100.0)
Platelets: 147 10*3/uL — ABNORMAL LOW (ref 150–400)
RBC: 3.23 MIL/uL — ABNORMAL LOW (ref 3.87–5.11)
RDW: 14.2 % (ref 11.5–15.5)
WBC: 14.1 10*3/uL — ABNORMAL HIGH (ref 4.0–10.5)
nRBC: 0 % (ref 0.0–0.2)

## 2019-12-12 MED ORDER — POLYSACCHARIDE IRON COMPLEX 150 MG PO CAPS
150.0000 mg | ORAL_CAPSULE | Freq: Every day | ORAL | Status: DC
  Administered 2019-12-12 – 2019-12-13 (×2): 150 mg via ORAL
  Filled 2019-12-12 (×2): qty 1

## 2019-12-12 NOTE — Lactation Note (Signed)
This note was copied from a baby's chart. Lactation Consultation Note  Patient Name: Rita Acosta TOIZT'I Date: 12/12/2019 Reason for consult: Follow-up assessment   LC arrived to room to assist family with feeding infant. Mother lying back in bed trying to syringe feed infant without finger . LC demonstrated proper finger feeding with syringe.   Assist mother in more up right position for breastfeeding. Attempt to latch infant on in cross cradle hold. Infant refuses breast without the nipple shield. A #20 NS placed and infant latched. Infant was given 5 ml of formula at the breast with the curved tip syringe. Infant suckled for 5 mins. Assist with widening infants gape.   Infant placed on alternate breast in football hold. Infant suckles better on the rt breast , mother nipple is more erect , but nipple shield was used on this breast as well. Infant sustained latch for 10 min. She was given another 5 ml of formula with a curved tip syringe.   Parents aware of plan to increase the amt of supplement for infant as needed. Mother to continue to pump after every breastfeeding.  Parents encouraged to be independent with feedings but to call if they are having difficulty with latch.  Assist mother with hand expession and unable to get any drops of colostrum. Plan of Care : Breastfeed infant with feeding cues Supplement infant with ebm/formula, according to supplemental guidelines. Pump using a DEBP after each feeding for 15-20 mins.   Mother to continue to cue base feed infant and feed at least 8-12 times or more in 24 hours and advised to allow for cluster feeding infant as needed.   Mother to continue to due STS. Mother is aware of available LC services at [facility], BFSG'S, OP Dept, and phone # for questions or concerns about breastfeeding.  Mother receptive to all teaching and plan of care.   Maternal Data    Feeding Feeding Type: Breast Fed  LATCH Score Latch: Grasps breast easily,  tongue down, lips flanged, rhythmical sucking.  Audible Swallowing: Spontaneous and intermittent  Type of Nipple: Everted at rest and after stimulation  Comfort (Breast/Nipple): Soft / non-tender  Hold (Positioning): Assistance needed to correctly position infant at breast and maintain latch.  LATCH Score: 9  Interventions Interventions: Assisted with latch;Skin to skin;Hand express;Breast compression;Adjust position;Support pillows;Position options;Hand pump;DEBP  Lactation Tools Discussed/Used Tools: Nipple Shields Nipple shield size: 20   Consult Status Consult Status: Follow-up Date: 12/13/19 Follow-up type: In-patient    Stevan Born [facility] 12/12/2019, 10:45 AM

## 2019-12-12 NOTE — Progress Notes (Signed)
Post Partum Day 1 Subjective: no complaints, up ad lib and tolerating PO  Feels weak but able to ambulate   Objective: Blood pressure 102/78, pulse 86, temperature 98 F (36.7 C), temperature source Oral, resp. rate 18, height 5' (1.524 m), weight 66.6 kg, SpO2 99 %, unknown if currently breastfeeding.  Physical Exam:  General: alert and cooperative Lochia: appropriate Uterine Fundus: firm   Recent Labs    12/11/19 0831 12/12/19 0513  HGB 11.7* 8.7*  HCT 35.5* 26.1*    Assessment/Plan: Plan for discharge tomorrow Tolerating anemia, will start iron   LOS: 1 day   Rita Acosta 12/12/2019, 9:24 AM

## 2019-12-13 ENCOUNTER — Encounter (HOSPITAL_COMMUNITY): Payer: Self-pay | Admitting: Obstetrics and Gynecology

## 2019-12-13 MED ORDER — POLYSACCHARIDE IRON COMPLEX 150 MG PO CAPS: 150.0000 mg | ORAL_CAPSULE | Freq: Every day | ORAL | 1 refills | Status: AC

## 2019-12-13 MED ORDER — IBUPROFEN 800 MG PO TABS
800.0000 mg | ORAL_TABLET | Freq: Three times a day (TID) | ORAL | 1 refills | Status: AC | PRN
Start: 2019-12-13 — End: ?

## 2019-12-13 NOTE — Lactation Note (Signed)
This note was copied from a baby's chart. Lactation Consultation Note  Patient Name: Girl Kobe Jansma DQQIW'L Date: 12/13/2019  [facility] entered room and mom trying to breastfed on arrival. Infant in football position and coming off and on the breast on moms right breast.  No interpreter used.  Infant takes a few sucks and either comes off or falls asleep. Mom has short nipples. Mom reports they do not have a manual pump. Showed mom how to prepump with manual pump to help evert nipples and get drops of colsotrum there. Very small drops of colsotrum noted. Asked mom if I could show her a different breastfeeding position.  Mom agreed. Switched mom to cross cradle hold.  Infant has about the same breastfeeding pattern. Infant cried a little while switching her.  Questionable tight frenulum.  Tongue center does not appear to come off the floor much at this time and when infant does phasic bites center of tongue looks heart shaped.   Mom has not been pumping everytime bottle has been given.   Stressed importance of pumping everytime alternate feeding is given at a minimum every 3 hours. Infant fell asleep at the breast.  Unable to wake her.  Gave her to dad and assisted mom with pumping.  Reviewed with mom pump settings.  Mom has Lansinoh pump at home.  Reviewed to turn it up till uncomfortable for maxiumum stimulation and then back down for comfort.  Discussed massage and hand expression with pumping as well. Urged dad to feed a bottle while mom pumping.  While LC talking with mom looked over and noted that while dad feeding infant,  infant had formula running down side of cheeks and down chin.  Had heard some gulping during feed.  Showed dad how to slow feeding and turn nipple up infant appeared to do better with this.  Still loosing a small amount of milk.  Dad says she has been doing this with each feeding. Spoke with RN who reported she fed her a bottle earlier and she did not loose any milk and did well with  it.  Gave dad some purple extra slow flow nipples and discussed with him to try and see if she did better with these. Reviewed nipple flow sheet with parents.   Asked parents to tell me feeding plan.  They were unable to tell me back.  Again reitterated to breastfeed first, then for mom to pump while dad feeds a bottle.  Reviewed both amounts for just bottle feeding and amounts for breastfeeding and then supplementing.  Reviewed them again as well. Reminded them they could feed more supplement than is on the sheet if infant wanted more.   Parents do not know if the pediatrician they are going to has lactation.  Urged parents to follow up with outpatient lactation. Urged to follow up with lactation as needed.  Parents have [facility] breastfeeding Consultation services handout for home use.        Mckensi Redinger S Andranik Jeune 12/13/2019, 11:59 AM

## 2019-12-13 NOTE — Discharge Summary (Signed)
Postpartum Discharge Summary  Date of Service updated     Patient Name: Rita Acosta DOB: 1990-06-04 MRN: [id number]  Date of admission: 12/11/2019 Delivery date:12/11/2019  Delivering provider: Janyth Contes  Date of discharge: 12/13/2019  Admitting diagnosis: Normal pregnancy, third trimester [Z34.93] Intrauterine pregnancy: [redacted]w[redacted]d    Secondary diagnosis:  Principal Problem:   SVD (spontaneous vaginal delivery) Active Problems:   Normal pregnancy, third trimester  Additional problems: postpartum anemia    Discharge diagnosis: Term Pregnancy Delivered                                              Post partum procedures:none Augmentation: AROM and Pitocin Complications: None  Hospital course: Induction of Labor With Vaginal Delivery   29y.o. yo G2P1001 at 30w3das admitted to the hospital 12/11/2019 for induction of labor.  Indication for induction: Favorable cervix at term.  Patient had an uncomplicated labor course as follows: Membrane Rupture Time/Date: 8:58 AM ,12/11/2019   Delivery Method:Vaginal, Vacuum (Extractor)  Episiotomy: None  Lacerations:  3rd degree  Details of delivery can be found in separate delivery note.  Patient had a routine postpartum course. Patient is discharged home 12/13/19.  Newborn Data: Birth date:12/11/2019  Birth time:12:32 PM  Gender:Female  Living status:Living  Apgars:8 ,8  Weight:3270 g   Magnesium Sulfate received: No BMZ received: No Rhophylac:No MMR:No T-DaP:Given prenatally Flu: No Transfusion:No  Physical exam  Vitals:   12/12/19 1400 12/12/19 2224 12/12/19 2258 12/13/19 0537  BP: 90/66 (!) _0  Pulse: 95 84 82 81  Resp: _1 Temp: 98.1 F (36.7 C) 98.5 F (36.9 C)  97.8 F (36.6 C)  TempSrc: Oral Oral  Oral  SpO2: 99%   100%  Weight:      Height:       General: alert, cooperative and no distress Lochia: appropriate Uterine Fundus: firm Incision: N/A DVT Evaluation: No evidence of DVT  seen on physical exam. Negative Homan's sign. No cords or calf tenderness. Labs: Lab Results  Component Value Date   WBC 14.1 (H) 12/12/2019   HGB 8.7 (L) 12/12/2019   HCT 26.1 (L) 12/12/2019   MCV 80.8 12/12/2019   PLT 147 (L) 12/12/2019   No flowsheet data found. Edinburgh Score: No flowsheet data found.    After visit meds:  Allergies as of 12/13/2019   No Known Allergies     Medication List    STOP taking these medications   IRON PO     TAKE these medications   Concept OB 130-92.4-1 MG Caps Take 1 tablet by mouth daily.   docusate sodium 100 MG capsule Commonly known as: COLACE Take 100 mg by mouth daily.   ibuprofen 800 MG tablet Commonly known as: ADVIL Take 1 tablet (800 mg total) by mouth every 8 (eight) hours as needed.   iron polysaccharides 150 MG capsule Commonly known as: NIFEREX Take 1 capsule (150 mg total) by mouth daily.   pantoprazole 40 MG tablet Commonly known as: PROTONIX Take 40 mg by mouth daily.        Discharge home in stable condition Infant Feeding: both Infant Disposition:home with mother Discharge instruction: per After Visit Summary and Postpartum booklet. Activity: Advance as tolerated. Pelvic rest for 6 weeks.  Diet: routine diet Anticipated Birth Control: Unsure Postpartum Appointment:6 weeks Future Appointments:No future  appointments. Follow up Visit:      12/13/2019 Deliah Boston, MD

## 2019-12-13 NOTE — Lactation Note (Signed)
This note was copied from a baby's chart. Lactation Consultation Note  Patient Name: Rita Acosta HOOIL'N Date: 12/13/2019    [facility] visit attempted, but Mother was sleeping. Dad noted to be awake, sitting in chair, holding infant. LC to return later.    Lurline Hare [facility] 12/13/2019, 8:28 AM

## 2019-12-13 NOTE — Progress Notes (Addendum)
Post Partum Day  Subjective: no complaints, up ad lib and tolerating PO  Fatigue stable on PO iron. Baby girl stable on room air, still some issues with feeding volumes  Objective: Blood pressure 95/72, pulse 81, temperature 97.8 F (36.6 C), temperature source Oral, resp. rate 16, height 5' (1.524 m), weight 66.6 kg, SpO2 100 %, unknown if currently breastfeeding.  Physical Exam:  General: alert and cooperative Lochia: appropriate Uterine Fundus: firm GU: suture intavt  Recent Labs    12/11/19 0831 12/12/19 0513  HGB 11.7* 8.7*  HCT 35.5* 26.1*    Assessment/Plan: Discharge home, Breastfeeding and Contraception unsure Continue PO iron for postpartum anemia. Reviewed GU care. Also answered questions regarding COVID vaccination, ok in postpartum period   LOS: 2 days   Carlisle Cater 12/13/2019, 9:00 AM

## 2019-12-29 DIAGNOSIS — N898 Other specified noninflammatory disorders of vagina: Secondary | ICD-10-CM | POA: Diagnosis not present

## 2020-01-03 DIAGNOSIS — Z419 Encounter for procedure for purposes other than remedying health state, unspecified: Secondary | ICD-10-CM | POA: Diagnosis not present

## 2020-01-22 DIAGNOSIS — Z1389 Encounter for screening for other disorder: Secondary | ICD-10-CM | POA: Diagnosis not present

## 2020-01-22 DIAGNOSIS — Z3009 Encounter for other general counseling and advice on contraception: Secondary | ICD-10-CM | POA: Diagnosis not present

## 2020-01-22 DIAGNOSIS — N898 Other specified noninflammatory disorders of vagina: Secondary | ICD-10-CM | POA: Diagnosis not present

## 2020-02-02 DIAGNOSIS — Z419 Encounter for procedure for purposes other than remedying health state, unspecified: Secondary | ICD-10-CM | POA: Diagnosis not present

## 2020-02-07 DIAGNOSIS — R309 Painful micturition, unspecified: Secondary | ICD-10-CM | POA: Diagnosis not present

## 2020-02-07 DIAGNOSIS — N8189 Other female genital prolapse: Secondary | ICD-10-CM | POA: Diagnosis not present

## 2020-02-14 DIAGNOSIS — N898 Other specified noninflammatory disorders of vagina: Secondary | ICD-10-CM | POA: Diagnosis not present

## 2020-02-14 DIAGNOSIS — N8189 Other female genital prolapse: Secondary | ICD-10-CM | POA: Diagnosis not present

## 2020-02-29 DIAGNOSIS — N8189 Other female genital prolapse: Secondary | ICD-10-CM | POA: Diagnosis not present

## 2020-02-29 DIAGNOSIS — N7689 Other specified inflammation of vagina and vulva: Secondary | ICD-10-CM | POA: Diagnosis not present

## 2020-02-29 DIAGNOSIS — N814 Uterovaginal prolapse, unspecified: Secondary | ICD-10-CM | POA: Diagnosis not present

## 2020-02-29 DIAGNOSIS — N898 Other specified noninflammatory disorders of vagina: Secondary | ICD-10-CM | POA: Diagnosis not present

## 2020-03-04 DIAGNOSIS — Z419 Encounter for procedure for purposes other than remedying health state, unspecified: Secondary | ICD-10-CM | POA: Diagnosis not present

## 2020-04-03 DIAGNOSIS — Z419 Encounter for procedure for purposes other than remedying health state, unspecified: Secondary | ICD-10-CM | POA: Diagnosis not present

## 2020-04-04 ENCOUNTER — Encounter: Payer: Self-pay | Admitting: Physical Therapy

## 2020-04-04 ENCOUNTER — Ambulatory Visit: Payer: Medicaid Other | Attending: Obstetrics and Gynecology | Admitting: Physical Therapy

## 2020-04-04 ENCOUNTER — Other Ambulatory Visit: Payer: Self-pay

## 2020-04-04 DIAGNOSIS — M6281 Muscle weakness (generalized): Secondary | ICD-10-CM | POA: Diagnosis not present

## 2020-04-04 DIAGNOSIS — R279 Unspecified lack of coordination: Secondary | ICD-10-CM | POA: Diagnosis not present

## 2020-04-04 NOTE — Therapy (Addendum)
[facility] [address] [place], [place], [address] Phone: [phone]   Fax:  [phone]  Physical Therapy Evaluation  Patient Details  Name: Rita Acosta MRN: [id number] Date of Birth: 07/13/1990 Referring Provider (PT): Sherian Rein, MD   Encounter Date: 04/04/2020   PT End of Session - 04/04/20 1139    Visit Number 1    Date for PT Re-Evaluation 05/30/20    PT Start Time 1016    PT Stop Time 1100    PT Time Calculation (min) 44 min    Activity Tolerance Patient tolerated treatment well    Behavior During Therapy [facility] for tasks assessed/performed           Past Medical History:  Diagnosis Date  . Anemia   . Infection    pneumonia as infant and malaria infection at 12 years old  . Preterm premature rupture of membranes (PPROM) with unknown onset of labor 08/31/2017  . SVD (spontaneous vaginal delivery) 12/11/2019    Past Surgical History:  Procedure Laterality Date  . NO PAST SURGERIES      There were no vitals filed for this visit.    Subjective Assessment - 04/04/20 1023    Subjective Pt presents to skilled PT after delivery on 12/11/19 which was delivered with vacuum.  Pt has uterine prolapse and    Patient is accompained by: Family member   husband   Pertinent History vacuum delivery    Patient Stated Goals pt wants to know how to do exercises at home    Currently in Pain? No/denies              [facility] PT Assessment - 04/04/20 0001      Assessment   Medical Diagnosis N81.89 (ICD-10-CM) - Other female genital prolapse    Referring Provider (PT) Bovard-Stuckert, Jody, MD    Prior Therapy No      Precautions   Precautions None      Balance Screen   Has the patient fallen in the past 6 months No      Home Environment   Living Environment Private residence    Living Arrangements Spouse/significant other      Prior Function   Level of Independence Independent      Cognition     Overall Cognitive Status Within Functional Limits for tasks assessed      Posture/Postural Control   Posture/Postural Control Postural limitations    Postural Limitations Anterior pelvic tilt;Increased lumbar lordosis      ROM / Strength   AROM / PROM / Strength PROM;Strength      PROM   Overall PROM Comments Rt hip ROM 25% limited      Strength   Overall Strength Comments hip abduction 4/5; core weakness and difficulty holding positions during MMT      Flexibility   Soft Tissue Assessment /Muscle Length yes    Hamstrings 80%      Palpation   Palpation comment tight lumbar; weakness of abdominal wall      Ambulation/Gait   Gait Pattern Decreased stride length                      Objective measurements completed on examination: See above findings.     Pelvic Floor Special Questions - 04/04/20 0001    Prior Pregnancies Yes    Number of Pregnancies 2   1 full term   Number of Vaginal Deliveries 1    Any difficulty  with labor and deliveries Yes    Diastasis Recti 1.5 fingers superior to umbilicus    Currently Sexually Active Yes    Is this Painful Yes    Marinoff Scale discomfort that does not affect completion    Urinary Leakage No    Urinary urgency Yes    Urinary frequency No    Fecal incontinence No    Falling out feeling (prolapse) Yes    Pelvic Floor Internal Exam pt identity confirmed and informed consent given    Exam Type Vaginal    Palpation tight Rt levators; overall higher tone; hard time relaxing after contraction    Strength weak squeeze, no lift    Strength # of seconds 5    Tone high             [facility] Adult PT Treatment/Exercise - 04/04/20 0001      Self-Care   Self-Care Other Self-Care Comments    Other Self-Care Comments  reviewed stretch and relax pelvic floor; contract and hold 3 sec                       PT Long Term Goals - 04/04/20 1105      PT LONG TERM GOAL #1   Title pt will be ind with HEP    Time 8     Period Weeks    Status New    Target Date 05/30/20      PT LONG TERM GOAL #2   Title Pt will report 50% less prolapse    Time 8    Period Weeks    Status New    Target Date 05/30/20      PT LONG TERM GOAL #3   Title Pt will report 50% less discomfort with intercourse    Time 8    Period Weeks    Status New    Target Date 05/30/20      PT LONG TERM GOAL #4   Title Pt will be able to sustain pelvic floor contraction for at least 20 seconds for reduction of prolapse    Time 8    Period Weeks    Status New    Target Date 05/30/20                  Plan - 04/04/20 1100    Clinical Impression Statement Pt presents to skilled PT due to prolapse.  Pt has hip and core weakness.  She has pelvic floor MMT of 2/5 with reduced endurance of 5 seconds.  Pt has posture abnormalities as mentioned above and DRA.  Pt will benefit from addressing All impairments for return to maximum function.    Examination-Activity Limitations Carry    Examination-Participation Restrictions Cleaning;Interpersonal Relationship    Stability/Clinical Decision Making Stable/Uncomplicated    Clinical Decision Making Low    Rehab Potential Excellent    PT Frequency 1x / week    PT Duration 8 weeks    PT Treatment/Interventions ADLs/Self Care Home Management;Biofeedback;Cryotherapy;Electrical Stimulation;Moist Heat;Therapeutic exercise;Neuromuscular re-education;Therapeutic activities;Patient/family education;Manual techniques;Dry needling    PT Next Visit Plan review kegel; do in various positions; core strength and mesentary release for abdominal strength    PT Home Exercise Plan self stretch and kegel 3 sec hold 5x10/ day    Consulted and Agree with Plan of Care Patient;Family member/caregiver    Family Member Consulted husband to interpret           Patient will benefit from skilled therapeutic intervention  in order to improve the following deficits and impairments:  Increased muscle spasms,  Postural dysfunction, Decreased strength, Decreased coordination  Visit Diagnosis: Muscle weakness (generalized) - Plan: PT plan of care cert/re-cert  Unspecified lack of coordination - Plan: PT plan of care cert/re-cert     Problem List Patient Active Problem List   Diagnosis Date Noted  . Normal pregnancy, third trimester 12/11/2019  . SVD (spontaneous vaginal delivery) 12/11/2019  . History of pregnancy loss, not currently pregnant 10/20/2017    Junious Silk, PT 04/04/2020, 3:43 PM  [facility] [address], [address] [place], [place], [address] Phone: [phone]   Fax:  [phone]  Name: Charnese Federici MRN: [id number] Date of Birth: [birth year]

## 2020-04-11 ENCOUNTER — Ambulatory Visit: Payer: Medicaid Other | Admitting: Physical Therapy

## 2020-04-11 ENCOUNTER — Other Ambulatory Visit: Payer: Self-pay

## 2020-04-11 ENCOUNTER — Encounter: Payer: Self-pay | Admitting: Physical Therapy

## 2020-04-11 DIAGNOSIS — R279 Unspecified lack of coordination: Secondary | ICD-10-CM

## 2020-04-11 DIAGNOSIS — M6281 Muscle weakness (generalized): Secondary | ICD-10-CM | POA: Diagnosis not present

## 2020-04-11 NOTE — Patient Instructions (Signed)
Access Code: [email or web address] URL: [email or web address] Date: 04/11/2020 Prepared by: Dwana Curd  Exercises Quadruped Transversus Abdominis Bracing - 1 x daily - 7 x weekly - 2 sets - 10 reps - 5 sec hold Quadruped Rocking Slow - 1 x daily - 7 x weekly - 3 sets - 10 reps Quadruped Alternating Arm Lift - 1 x daily - 7 x weekly - 2 sets - 10 reps Supine Butterfly Groin Stretch - 1 x daily - 7 x weekly - 1 sets - 3 reps - 30 sec hold Child's Pose with Sidebending - 1 x daily - 7 x weekly - 1 sets - 3 reps - 30 sec hold Supine Hip Adductor Squeeze with Small Ball - 3 x daily - 7 x weekly - 1 sets - 10 reps - 3 sec hold Mini Squat - 1 x daily - 7 x weekly - 3 sets - 10 reps

## 2020-04-11 NOTE — Therapy (Signed)
[facility] [address] [place], [place], [address] Phone: [phone]   Fax:  [phone]  Physical Therapy Treatment  Patient Details  Name: Rita Acosta MRN: [id number] Date of Birth: 10-28-1990 Referring Provider (PT): Sherian Rein, MD   Encounter Date: 04/11/2020   PT End of Session - 04/11/20 1203    Visit Number 2    Date for PT Re-Evaluation 05/30/20    PT Start Time 1020    PT Stop Time 1101    PT Time Calculation (min) 41 min    Activity Tolerance Patient tolerated treatment well    Behavior During Therapy [facility] for tasks assessed/performed           Past Medical History:  Diagnosis Date  . Anemia   . Infection    pneumonia as infant and malaria infection at 51 years old  . Preterm premature rupture of membranes (PPROM) with unknown onset of labor 08/31/2017  . SVD (spontaneous vaginal delivery) 12/11/2019    Past Surgical History:  Procedure Laterality Date  . NO PAST SURGERIES      There were no vitals filed for this visit.   Subjective Assessment - 04/11/20 1021    Subjective Pt not noticing changes yet.  States she was confused at what to do and how to stretch pelvic floor    Patient is accompained by: Family member   husband to interpret   Pertinent History vacuum delivery    Patient Stated Goals pt wants to know how to do exercises at home    Currently in Pain? No/denies                             [facility] Adult PT Treatment/Exercise - 04/11/20 0001      Self-Care   Other Self-Care Comments  educated and reviewed correct lifting technique      Exercises   Exercises Lumbar      Lumbar Exercises: Stretches   Single Knee to Chest Stretch 30 seconds    Double Knee to Chest Stretch 30 seconds    Other Lumbar Stretch Exercise child pose; child pose with SB    Other Lumbar Stretch Exercise butterfly 30 sec      Lumbar Exercises: Standing   Functional Squats  15 reps    Functional Squats Limitations using wall and mirror to cue technique      Lumbar Exercises: Supine   Other Supine Lumbar Exercises ball squeeze with kegel      Lumbar Exercises: Quadruped   Single Arm Raise Right;Left;10 reps    Other Quadruped Lumbar Exercises TrA activation and rocking                  PT Education - 04/11/20 1100    Education Details Access Code: [email or web address]    Person(s) Educated Patient    Methods Explanation;Demonstration;Tactile cues;Verbal cues    Comprehension Verbalized understanding;Returned demonstration               PT Long Term Goals - 04/04/20 1105      PT LONG TERM GOAL #1   Title pt will be ind with HEP    Time 8    Period Weeks    Status New    Target Date 05/30/20      PT LONG TERM GOAL #2   Title Pt will report 50% less prolapse    Time 8    Period Weeks  Status New    Target Date 05/30/20      PT LONG TERM GOAL #3   Title Pt will report 50% less discomfort with intercourse    Time 8    Period Weeks    Status New    Target Date 05/30/20      PT LONG TERM GOAL #4   Title Pt will be able to sustain pelvic floor contraction for at least 20 seconds for reduction of prolapse    Time 8    Period Weeks    Status New    Target Date 05/30/20                 Plan - 04/11/20 1204    Clinical Impression Statement Pt did well with initial HEP given today.  Pt is very tight lin lumbar spine and has a hard time maintaining neutral spine. Improved with using mirror to cue.  Pt received a lot of education on proper lifting technique.  She was able to perform basic core strengthening exercises with VC and TC.  Pt at first session since eval and will benefit from skilled PT to continue to work toward POC.    PT Treatment/Interventions ADLs/Self Care Home Management;Biofeedback;Cryotherapy;Electrical Stimulation;Moist Heat;Therapeutic exercise;Neuromuscular re-education;Therapeutic activities;Patient/family  education;Manual techniques;Dry needling    PT Next Visit Plan review HEP, lifting technique; if still having pain possibly internal STM    PT Home Exercise Plan Access Code: [email or web address]; self stretch    Consulted and Agree with Plan of Care Patient;Family member/caregiver    Family Member Consulted husband to interpret           Patient will benefit from skilled therapeutic intervention in order to improve the following deficits and impairments:  Increased muscle spasms,Postural dysfunction,Decreased strength,Decreased coordination  Visit Diagnosis: Muscle weakness (generalized)  Unspecified lack of coordination     Problem List Patient Active Problem List   Diagnosis Date Noted  . Normal pregnancy, third trimester 12/11/2019  . SVD (spontaneous vaginal delivery) 12/11/2019  . History of pregnancy loss, not currently pregnant 10/20/2017    Junious Silk, PT 04/11/2020, 12:14 PM  [facility] [address], [address] [place], [place], [address] Phone: [phone]   Fax:  [phone]  Name: Rita Acosta MRN: [id number] Date of Birth: Sep 30, 1990

## 2020-04-17 ENCOUNTER — Ambulatory Visit: Payer: Medicaid Other | Admitting: Physical Therapy

## 2020-04-17 DIAGNOSIS — Z20822 Contact with and (suspected) exposure to covid-19: Secondary | ICD-10-CM | POA: Diagnosis not present

## 2020-04-18 ENCOUNTER — Ambulatory Visit (HOSPITAL_COMMUNITY)
Admission: EM | Admit: 2020-04-18 | Discharge: 2020-04-18 | Disposition: A | Discharge status: Home / Self Care | Payer: Medicaid Other

## 2020-04-18 ENCOUNTER — Other Ambulatory Visit: Payer: Medicaid Other

## 2020-04-18 ENCOUNTER — Other Ambulatory Visit: Payer: Self-pay

## 2020-04-18 ENCOUNTER — Encounter (HOSPITAL_COMMUNITY): Payer: Self-pay | Admitting: Emergency Medicine

## 2020-04-18 DIAGNOSIS — U071 COVID-19: Secondary | ICD-10-CM | POA: Diagnosis present

## 2020-04-18 DIAGNOSIS — Z20822 Contact with and (suspected) exposure to covid-19: Secondary | ICD-10-CM | POA: Diagnosis not present

## 2020-04-18 MED ORDER — ALBUTEROL SULFATE HFA 108 (90 BASE) MCG/ACT IN AERS: 2.0000 | INHALATION_SPRAY | RESPIRATORY_TRACT | 0 refills | Status: AC | PRN

## 2020-04-18 NOTE — ED Triage Notes (Addendum)
Coughing, fever for 3 days, including runny nose.  Requesting medicine for cough  Reports positive covid test

## 2020-04-18 NOTE — ED Provider Notes (Signed)
[facility]    CSN: [id number] Arrival date & time: 04/18/20  1131      History   Chief Complaint Chief Complaint  Patient presents with  . Cough    HPI Rita Acosta is a 29 y.o. female.   29 year old female presents to urgent care with chief complaint of coughing x3 days states tested positive for Covid yesterday, requesting cough medicine.  Patient is in no respiratory distress at this time alert active O2 sat is 97-98% on room air  The history is provided by the patient. No language interpreter was used.    Past Medical History:  Diagnosis Date  . Anemia   . Infection    pneumonia as infant and malaria infection at 68 years old  . Preterm premature rupture of membranes (PPROM) with unknown onset of labor 08/31/2017  . SVD (spontaneous vaginal delivery) 12/11/2019    Patient Active Problem List   Diagnosis Date Noted  . COVID-19 04/18/2020  . Normal pregnancy, third trimester 12/11/2019  . SVD (spontaneous vaginal delivery) 12/11/2019  . History of pregnancy loss, not currently pregnant 10/20/2017    Past Surgical History:  Procedure Laterality Date  . NO PAST SURGERIES      OB History    Gravida  2   Para  2   Term  1   Preterm      AB      Living  1     SAB      IAB      Ectopic      Multiple  0   Live Births  1            Home Medications    Prior to Admission medications   Medication Sig Start Date End Date Taking? Authorizing Provider  acetaminophen (TYLENOL) 325 MG tablet Take 650 mg by mouth every 6 (six) hours as needed.   Yes [provider]  albuterol (VENTOLIN HFA) 108 (90 Base) MCG/ACT inhaler Inhale 2 puffs into the lungs every 4 (four) hours as needed for up to 10 days for wheezing or shortness of breath. 04/18/20 04/28/20  Agripina Guyette, Para March, NP  docusate sodium (COLACE) 100 MG capsule Take 100 mg by mouth daily.    [provider]  ibuprofen (ADVIL) 800 MG tablet Take 1 tablet (800 mg  total) by mouth every 8 (eight) hours as needed. 12/13/19   Shivaji, Valerie Roys, MD  iron polysaccharides (NIFEREX) 150 MG capsule Take 1 capsule (150 mg total) by mouth daily. 12/13/19   Shivaji, Valerie Roys, MD  pantoprazole (PROTONIX) 40 MG tablet Take 40 mg by mouth daily. 12/07/19   [provider]  Prenat w/o A Vit-FeFum-FePo-FA (CONCEPT OB) 130-92.4-1 MG CAPS Take 1 tablet by mouth daily. 06/08/17   Dorathy Kinsman, CNM    Family History Family History  Problem Relation Age of Onset  . Hypertension Mother   . Diabetes Father     Social History Social History   Tobacco Use  . Smoking status: Never Smoker  . Smokeless tobacco: Never Used  Substance Use Topics  . Alcohol use: No  . Drug use: No     Allergies   Patient has no known allergies.   Review of Systems Review of Systems  Constitutional: Positive for fever. Negative for chills.  HENT: Positive for congestion. Negative for ear pain, rhinorrhea and sore throat.   Eyes: Negative.   Respiratory: Negative for cough.   Gastrointestinal: Negative for nausea and vomiting.  Endocrine:  Negative.   Genitourinary: Negative for dysuria.  Musculoskeletal: Negative for myalgias.  Skin: Negative for rash.  Allergic/Immunologic: Negative.   Neurological: Negative for headaches.  Hematological: Negative.   Psychiatric/Behavioral: Negative.   All other systems reviewed and are negative.    Physical Exam Triage Vital Signs ED Triage Vitals  Enc Vitals Group     BP 04/18/20 1421 99/78     Pulse Rate 04/18/20 1421 87     Resp 04/18/20 1421 20     Temp 04/18/20 1421 97.9 F (36.6 C)     Temp Source 04/18/20 1421 Oral     SpO2 04/18/20 1421 97 %     Weight --      Height --      Head Circumference --      Peak Flow --      Pain Score 04/18/20 1418 4     Pain Loc --      Pain Edu? --      Excl. in GC? --    No data found.  Updated Vital Signs BP 99/78 (BP Location: Right Arm)   Pulse 87   Temp 97.9 F (36.6  C) (Oral)   Resp 20   SpO2 97%   Visual Acuity Right Eye Distance:   Left Eye Distance:   Bilateral Distance:    Right Eye Near:   Left Eye Near:    Bilateral Near:     Physical Exam Vitals and nursing note reviewed.  Constitutional:      General: She is active. She is not in acute distress.    Appearance: She is well-developed and well-nourished.  HENT:     Head: Normocephalic.     Mouth/Throat:     Mouth: Oropharynx is clear and moist.  Eyes:     General: Lids are normal.     Extraocular Movements: EOM normal.     Conjunctiva/sclera: Conjunctivae normal.     Pupils: Pupils are equal, round, and reactive to light.  Neck:     Trachea: No tracheal deviation.  Cardiovascular:     Rate and Rhythm: Regular rhythm.     Pulses: Normal pulses.     Heart sounds: Normal heart sounds. No murmur heard.   Pulmonary:     Effort: Pulmonary effort is normal.     Breath sounds: Normal breath sounds.  Abdominal:     General: Bowel sounds are normal.     Palpations: Abdomen is soft.     Tenderness: There is no abdominal tenderness.  Musculoskeletal:        General: Normal range of motion.     Cervical back: Normal range of motion.  Lymphadenopathy:     Cervical: No cervical adenopathy.  Skin:    General: Skin is warm and dry.     Findings: No rash.  Neurological:     Mental Status: She is alert and oriented to person, place, and time.     GCS: GCS eye subscore is 4. GCS verbal subscore is 5. GCS motor subscore is 6.  Psychiatric:        Mood and Affect: Mood and affect normal.        Speech: Speech normal.        Behavior: Behavior normal. Behavior is cooperative.      UC Treatments / Results  Labs (all labs ordered are listed, but only abnormal results are displayed) Labs Reviewed - No data to display  EKG   Radiology No results found.  Procedures Procedures (  including critical care time)  Medications Ordered in UC Medications - No data to  display  Initial Impression / Assessment and Plan / UC Course  I have reviewed the triage vital signs and the nursing notes.  Pertinent labs & imaging results that were available during my care of the patient were reviewed by me and considered in my medical decision making (see chart for details).      Final Clinical Impressions(s) / UC Diagnoses   Final diagnoses:  COVID-19     Discharge Instructions     QUARANTINE for 2 weeks. Please follow up with ER if you get worse. Call [phone] if you are interested in monoclonal antibody infusion, leave information as requested. May use OTC cough medication as label directed. Can try Delsym or Robitussin.     ED Prescriptions    Medication Sig Dispense Auth. Provider   albuterol (VENTOLIN HFA) 108 (90 Base) MCG/ACT inhaler Inhale 2 puffs into the lungs every 4 (four) hours as needed for up to 10 days for wheezing or shortness of breath. 1 each Bridgitt Raggio, Para March, NP     PDMP not reviewed this encounter.   Clancy Gourd, NP 04/18/20 [phone]

## 2020-04-18 NOTE — Discharge Instructions (Addendum)
QUARANTINE for 2 weeks. Please follow up with ER if you get worse. Call [phone] if you are interested in monoclonal antibody infusion, leave information as requested. May use OTC cough medication as label directed. Can try Delsym or Robitussin.

## 2020-04-19 LAB — NOVEL CORONAVIRUS, NAA: SARS-CoV-2, NAA: DETECTED — AB

## 2020-04-19 LAB — SARS-COV-2, NAA 2 DAY TAT

## 2020-04-22 ENCOUNTER — Other Ambulatory Visit: Payer: Medicaid Other

## 2020-04-22 DIAGNOSIS — Z20822 Contact with and (suspected) exposure to covid-19: Secondary | ICD-10-CM

## 2020-04-24 LAB — NOVEL CORONAVIRUS, NAA: SARS-CoV-2, NAA: NOT DETECTED

## 2020-04-24 LAB — SARS-COV-2, NAA 2 DAY TAT

## 2020-04-25 DIAGNOSIS — Z03818 Encounter for observation for suspected exposure to other biological agents ruled out: Secondary | ICD-10-CM | POA: Diagnosis not present

## 2020-05-01 ENCOUNTER — Encounter: Payer: Medicaid Other | Admitting: Physical Therapy

## 2020-05-04 DIAGNOSIS — Z419 Encounter for procedure for purposes other than remedying health state, unspecified: Secondary | ICD-10-CM | POA: Diagnosis not present

## 2020-05-08 ENCOUNTER — Encounter: Payer: Self-pay | Admitting: Physical Therapy

## 2020-05-08 ENCOUNTER — Ambulatory Visit: Payer: Medicaid Other | Attending: Obstetrics and Gynecology | Admitting: Physical Therapy

## 2020-05-08 ENCOUNTER — Other Ambulatory Visit: Payer: Self-pay

## 2020-05-08 DIAGNOSIS — R279 Unspecified lack of coordination: Secondary | ICD-10-CM | POA: Insufficient documentation

## 2020-05-08 DIAGNOSIS — M6281 Muscle weakness (generalized): Secondary | ICD-10-CM | POA: Diagnosis not present

## 2020-05-08 NOTE — Therapy (Signed)
[facility] [address] [place], [place], [address] Phone: [phone]   Fax:  [phone]  Physical Therapy Treatment  Patient Details  Name: Rita Acosta MRN: [id number] Date of Birth: 1990-11-11 Referring Provider (PT): Sherian Rein, MD   Encounter Date: 05/08/2020   PT End of Session - 05/08/20 1150    Visit Number 3    Date for PT Re-Evaluation 05/30/20    PT Start Time 1150    PT Stop Time 1228    PT Time Calculation (min) 38 min    Activity Tolerance Patient tolerated treatment well    Behavior During Therapy [facility] for tasks assessed/performed           Past Medical History:  Diagnosis Date  . Anemia   . Infection    pneumonia as infant and malaria infection at 32 years old  . Preterm premature rupture of membranes (PPROM) with unknown onset of labor 08/31/2017  . SVD (spontaneous vaginal delivery) 12/11/2019    Past Surgical History:  Procedure Laterality Date  . NO PAST SURGERIES      There were no vitals filed for this visit.   Subjective Assessment - 05/08/20 1154    Subjective Pt states she is having back pain and bulging 2-4 days/week.    Pertinent History vacuum delivery    Patient Stated Goals pt wants to know how to do exercises at home    Currently in Pain? No/denies                          Pelvic Floor Special Questions - 05/08/20 0001    Pelvic Floor Internal Exam pt identity confirmed and informed consent given    Exam Type Vaginal    Palpation Rt pubococcygeus trigger point    Strength fair squeeze, definite lift    Strength # of reps 7    Strength # of seconds 8             [facility] Adult PT Treatment/Exercise - 05/08/20 0001      Self-Care   Other Self-Care Comments  reviewed correct lift technique with maintained pelvic tilt for reduced lumbar erector activation      Neuro Re-ed    Neuro Re-ed Details  breathing with stretches; breathing to  relax pelvic floor      Lumbar Exercises: Stretches   Piriformis Stretch Right;Left;1 rep;30 seconds    Figure 4 Stretch 1 rep;30 seconds    Other Lumbar Stretch Exercise happy baby 30 sec      Lumbar Exercises: Standing   Functional Squats 10 reps    Functional Squats Limitations holding 10 lb; cues to increase posterior pelvic tilt      Manual Therapy   Manual Therapy Internal Pelvic Floor    Manual therapy comments pt identity confirmed and internal soft tissue with pt consent - husband present to interpret    Internal Pelvic Floor pubococcygeus Rt side more than Lt                  PT Education - 05/08/20 1227    Education Details Access Code: [employer]    Person(s) Educated Patient;Spouse    Methods Explanation;Demonstration;Verbal cues;Handout;Tactile cues    Comprehension Verbalized understanding;Returned demonstration               PT Long Term Goals - 05/08/20 1532      PT LONG TERM GOAL #1   Title pt will  be ind with HEP    Status On-going      PT LONG TERM GOAL #2   Title Pt will report 50% less prolapse    Baseline no prolapse observed but reports "feels the same"    Status On-going      PT LONG TERM GOAL #3   Title Pt will report 50% less discomfort with intercourse    Baseline not having intercrouse due to pain    Status On-going      PT LONG TERM GOAL #4   Title Pt will be able to sustain pelvic floor contraction for at least 20 seconds for reduction of prolapse    Baseline 8 seconds    Status On-going                 Plan - 05/08/20 1523    Clinical Impression Statement Pt responded well to treatment with reduced muscle spasm in Rt pubococcygeus with STM and breathing techniques.  Pt was educated on stretches to continue working on pelvic floor lengthening and lumbar stretch.  Re-assess pelvic floor strength and was improved, but had a harder time to relax.  Pt educated on more focus on stretches.  Reviewed lifting techniques with  cues to elongate lumbar region and increased core activation.    PT Treatment/Interventions ADLs/Self Care Home Management;Biofeedback;Cryotherapy;Electrical Stimulation;Moist Heat;Therapeutic exercise;Neuromuscular re-education;Therapeutic activities;Patient/family education;Manual techniques;Dry needling    PT Next Visit Plan f/u on stretches; internal STM to re-assess tension in pubococcygeus    PT Home Exercise Plan Access Code: [email or web address]; self stretch    Consulted and Agree with Plan of Care Patient;Family member/caregiver    Family Member Consulted husband to interpret           Patient will benefit from skilled therapeutic intervention in order to improve the following deficits and impairments:  Increased muscle spasms,Postural dysfunction,Decreased strength,Decreased coordination  Visit Diagnosis: Muscle weakness (generalized)  Unspecified lack of coordination     Problem List Patient Active Problem List   Diagnosis Date Noted  . COVID-19 04/18/2020  . Normal pregnancy, third trimester 12/11/2019  . SVD (spontaneous vaginal delivery) 12/11/2019  . History of pregnancy loss, not currently pregnant 10/20/2017    Junious Silk, PT 05/08/2020, 7:07 PM  [facility] [address] [place], [place], [address] Phone: [phone]   Fax:  [phone]  Name: Rita Acosta MRN: [id number] Date of Birth: Nov 26, 1990

## 2020-05-08 NOTE — Patient Instructions (Signed)
Access Code: [email or web address] URL: [email or web address] Date: 05/08/2020 Prepared by: Dwana Curd  Exercises Quadruped Transversus Abdominis Bracing - 1 x daily - 7 x weekly - 2 sets - 10 reps - 5 sec hold Quadruped Rocking Slow - 1 x daily - 7 x weekly - 3 sets - 10 reps Quadruped Alternating Arm Lift - 1 x daily - 7 x weekly - 2 sets - 10 reps Supine Hip Adduction Isometric with Ball - 3 x daily - 7 x weekly - 1 sets - 10 reps - 3 sec hold Mini Squat - 1 x daily - 7 x weekly - 3 sets - 10 reps Supine Butterfly Groin Stretch - 1 x daily - 7 x weekly - 1 sets - 3 reps - 30 sec hold Child's Pose with Sidebending - 1 x daily - 7 x weekly - 1 sets - 3 reps - 30 sec hold Supine Piriformis Stretch with Foot on Ground - 1 x daily - 7 x weekly - 3 reps - 1 sets - 30 sec hold Supine Piriformis Stretch - 1 x daily - 7 x weekly - 3 reps - 1 sets - 30 sec hold Supine Pelvic Floor Stretch - 1 x daily - 7 x weekly - 3 reps - 1 sets - 30 sec hold Supine Diaphragmatic Breathing - 3 x daily - 7 x weekly - 10 reps - 1 sets

## 2020-05-15 ENCOUNTER — Ambulatory Visit: Payer: Medicaid Other | Admitting: Physical Therapy

## 2020-05-15 ENCOUNTER — Other Ambulatory Visit: Payer: Self-pay

## 2020-05-15 ENCOUNTER — Encounter: Payer: Self-pay | Admitting: Physical Therapy

## 2020-05-15 DIAGNOSIS — M6281 Muscle weakness (generalized): Secondary | ICD-10-CM

## 2020-05-15 DIAGNOSIS — R279 Unspecified lack of coordination: Secondary | ICD-10-CM | POA: Diagnosis not present

## 2020-05-15 NOTE — Therapy (Signed)
[facility] [address], [address] [place], [place], [address] Phone: [phone]   Fax:  [phone]  Physical Therapy Treatment  Patient Details  Name: Rita Acosta MRN: [id number] Date of Birth: Oct 15, 1990 Referring Provider (PT): Sherian Rein, MD   Encounter Date: 05/15/2020   PT End of Session - 05/15/20 1956    Visit Number 4    Date for PT Re-Evaluation 05/30/20    PT Start Time 1533    PT Stop Time 1612    PT Time Calculation (min) 39 min    Activity Tolerance Patient tolerated treatment well    Behavior During Therapy [facility] for tasks assessed/performed           Past Medical History:  Diagnosis Date  . Anemia   . Infection    pneumonia as infant and malaria infection at 1 years old  . Preterm premature rupture of membranes (PPROM) with unknown onset of labor 08/31/2017  . SVD (spontaneous vaginal delivery) 12/11/2019    Past Surgical History:  Procedure Laterality Date  . NO PAST SURGERIES      There were no vitals filed for this visit.   Subjective Assessment - 05/15/20 1538    Subjective Pt states she has still felt uterine prolapse when using the bathroom.  She states that is still the same.  Not had intercourse because it has been painful last time and she is nervous.    Patient is accompained by: Family member   husband   Patient Stated Goals pt wants to know how to do exercises at home    Currently in Pain? No/denies                             [facility] Adult PT Treatment/Exercise - 05/15/20 0001      Self-Care   Other Self-Care Comments  sit to stand and kegel before standing after toileting; sexual position variations to help find painfree position - handout provided      Lumbar Exercises: Seated   Other Seated Lumbar Exercises sit to stand with kegel                  PT Education - 05/15/20 1956    Education Details handout for positions less pain; update  medbridge with sit to stand    Person(s) Educated Patient    Methods Explanation;Demonstration;Verbal cues;Handout    Comprehension Verbalized understanding;Returned demonstration               PT Long Term Goals - 05/08/20 1532      PT LONG TERM GOAL #1   Title pt will be ind with HEP    Status On-going      PT LONG TERM GOAL #2   Title Pt will report 50% less prolapse    Baseline no prolapse observed but reports "feels the same"    Status On-going      PT LONG TERM GOAL #3   Title Pt will report 50% less discomfort with intercourse    Baseline not having intercrouse due to pain    Status On-going      PT LONG TERM GOAL #4   Title Pt will be able to sustain pelvic floor contraction for at least 20 seconds for reduction of prolapse    Baseline 8 seconds    Status On-going                 Plan -  05/15/20 1936    Clinical Impression Statement Pt was given education on kegel with sit to stand and to do this during toileting before standing to reposition any prolapse.  Pt was given handout and explaination of postions during intercourse that may be helpful in finding ones that are better.    PT Treatment/Interventions ADLs/Self Care Home Management;Biofeedback;Cryotherapy;Electrical Stimulation;Moist Heat;Therapeutic exercise;Neuromuscular re-education;Therapeutic activities;Patient/family education;Manual techniques;Dry needling    PT Next Visit Plan f/u on toileting; kegel endurance biofeedback perhaps    PT Home Exercise Plan Access Code: [email or web address]; self stretch    Consulted and Agree with Plan of Care Patient;Family member/caregiver    Family Member Consulted husband to interpret           Patient will benefit from skilled therapeutic intervention in order to improve the following deficits and impairments:  Increased muscle spasms,Postural dysfunction,Decreased strength,Decreased coordination  Visit Diagnosis: Muscle weakness (generalized)  Unspecified lack  of coordination     Problem List Patient Active Problem List   Diagnosis Date Noted  . COVID-19 04/18/2020  . Normal pregnancy, third trimester 12/11/2019  . SVD (spontaneous vaginal delivery) 12/11/2019  . History of pregnancy loss, not currently pregnant 10/20/2017    Junious Silk, PT 05/15/2020, 7:58 PM  [facility] [address] [place], [place], [address] Phone: [phone]   Fax:  [phone]  Name: Rita Acosta MRN: [id number] Date of Birth: 03/15/1991

## 2020-05-22 ENCOUNTER — Ambulatory Visit: Payer: Medicaid Other | Admitting: Physical Therapy

## 2020-05-22 DIAGNOSIS — J309 Allergic rhinitis, unspecified: Secondary | ICD-10-CM | POA: Diagnosis not present

## 2020-05-22 DIAGNOSIS — N814 Uterovaginal prolapse, unspecified: Secondary | ICD-10-CM | POA: Diagnosis not present

## 2020-05-22 DIAGNOSIS — M5416 Radiculopathy, lumbar region: Secondary | ICD-10-CM | POA: Diagnosis not present

## 2020-05-22 DIAGNOSIS — Z7689 Persons encountering health services in other specified circumstances: Secondary | ICD-10-CM | POA: Diagnosis not present

## 2020-05-23 ENCOUNTER — Ambulatory Visit: Payer: Medicaid Other | Admitting: Physical Therapy

## 2020-05-23 ENCOUNTER — Other Ambulatory Visit: Payer: Self-pay

## 2020-05-23 ENCOUNTER — Encounter: Payer: Self-pay | Admitting: Physical Therapy

## 2020-05-23 DIAGNOSIS — M6281 Muscle weakness (generalized): Secondary | ICD-10-CM | POA: Diagnosis not present

## 2020-05-23 DIAGNOSIS — R279 Unspecified lack of coordination: Secondary | ICD-10-CM | POA: Diagnosis not present

## 2020-05-23 NOTE — Therapy (Signed)
[facility] [address] [place], [place], [address] Phone: [phone]   Fax:  [phone]  Physical Therapy Treatment  Patient Details  Name: Rita Acosta MRN: [id number] Date of Birth: 12/26/1990 Referring Provider (PT): Sherian Rein, MD   Encounter Date: 05/23/2020   PT End of Session - 05/23/20 0851    Visit Number 5    Date for PT Re-Evaluation 07/18/20    PT Start Time 0850    PT Stop Time 0930    PT Time Calculation (min) 40 min    Activity Tolerance Patient tolerated treatment well    Behavior During Therapy [facility] for tasks assessed/performed           Past Medical History:  Diagnosis Date  . Anemia   . Infection    pneumonia as infant and malaria infection at 18 years old  . Preterm premature rupture of membranes (PPROM) with unknown onset of labor 08/31/2017  . SVD (spontaneous vaginal delivery) 12/11/2019    Past Surgical History:  Procedure Laterality Date  . NO PAST SURGERIES      There were no vitals filed for this visit.   Subjective Assessment - 05/24/20 0953    Subjective Pt and spouse (to interpret) states only small amount of pain with intercourse but not bad and feels prolapse only after using the bathroom to pee or poop . Once she is finished in the bathroom she can tighten the muscles and doesn't have the sensation anymore.  Pt reports this is the same.  Pt states she can feel there is more strength when doing the exercises    Patient is accompained by: Family member   husband   Patient Stated Goals pt wants to know how to do exercises at home    Currently in Pain? No/denies              [facility] PT Assessment - 05/24/20 0001      Assessment   Medical Diagnosis N81.89 (ICD-10-CM) - Other female genital prolapse    Referring Provider (PT) Bovard-Stuckert, Jody, MD      Strength   Overall Strength Comments more stability in core; DRA 1 finger at and just above umbilicus;  hip abduction 4+/5                      Pelvic Floor Special Questions - 05/24/20 0001    Diastasis Recti 1 finger just superior to umbilicus    Urinary urgency No   sometimes hard time emptying bladder            [facility] Adult PT Treatment/Exercise - 05/24/20 0001      Lumbar Exercises: Stretches   Other Lumbar Stretch Exercise child pose with breathing and sidebending      Lumbar Exercises: Standing   Functional Squats 10 reps    Functional Squats Limitations cues for posture - no weights - cues to engage pelvic floor and to exhale if this is feeling hard      Lumbar Exercises: Supine   Other Supine Lumbar Exercises core/TrA small march and heel slides                       PT Long Term Goals - 05/23/20 0912      PT LONG TERM GOAL #1   Title pt will be ind with HEP    Baseline still working on more basic exercises    Time 8  Period Weeks    Status On-going    Target Date 07/18/20      PT LONG TERM GOAL #2   Title Pt will report 50% less prolapse    Baseline not noticeably different; still feel it every time she uses the bathroom    Time 8    Period Weeks    Status New    Target Date 07/18/20      PT LONG TERM GOAL #3   Title Pt will report 50% less discomfort with intercourse    Baseline a little discomfort, able to have intercourse    Time 8    Period Weeks    Status On-going    Target Date 07/18/20      PT LONG TERM GOAL #4   Title Pt will be able to sustain pelvic floor contraction for at least 20 seconds for reduction of prolapse    Baseline 8 seconds    Time 8    Period Weeks    Status On-going    Target Date 07/18/20                 Plan - 05/24/20 0958    Clinical Impression Statement Today's session was spent discussing technique with current exercises so she can continue to progress.  Pt needs extra time to ensure she is doing exercises correctly as she just began to feel the muscle engage more.  She is expected  to continue to make more progress with strength at this time since she has been able to activate the muscle better.  Pt also demonstrates improvement with core strength and has reduced diastasis of rectus abdominus to 1 finger just above umbilicus which was previously 1.5 fingers.  She is still having some pain with intercourse but that has also improved.  Overall, still making improvements but not at expected level of function at this time.  Pt will benefit from skilled PT to continue to address core and pelvic floor strength as well as body mechanics in order to achieve all functional goals and reduce risk of furhter injuries.    Examination-Activity Limitations Carry;Toileting    Examination-Participation Restrictions Cleaning;Interpersonal Relationship    Rehab Potential Excellent    PT Frequency 1x / week    PT Duration 8 weeks    PT Treatment/Interventions ADLs/Self Care Home Management;Biofeedback;Cryotherapy;Electrical Stimulation;Moist Heat;Therapeutic exercise;Neuromuscular re-education;Therapeutic activities;Patient/family education;Manual techniques;Dry needling    PT Next Visit Plan kegel in quadruped; child pose, internal STM if still having pain and to reassess endurance    PT Home Exercise Plan Access Code: [email or web address]; self stretch    Consulted and Agree with Plan of Care Patient;Family member/caregiver    Family Member Consulted husband to interpret           Patient will benefit from skilled therapeutic intervention in order to improve the following deficits and impairments:  Increased muscle spasms,Postural dysfunction,Decreased strength,Decreased coordination  Visit Diagnosis: Muscle weakness (generalized)  Unspecified lack of coordination     Problem List Patient Active Problem List   Diagnosis Date Noted  . COVID-19 04/18/2020  . Normal pregnancy, third trimester 12/11/2019  . SVD (spontaneous vaginal delivery) 12/11/2019  . History of pregnancy loss, not currently  pregnant 10/20/2017    Junious Silk, PT 05/24/2020, 10:17 AM  [facility] [address], [address] [place], [place], [address] Phone: [phone]   Fax:  [phone]  Name: Rita Acosta MRN: [id number] Date of Birth: 03-Nov-1990

## 2020-05-29 ENCOUNTER — Encounter: Payer: Self-pay | Admitting: Physical Therapy

## 2020-05-29 ENCOUNTER — Ambulatory Visit: Payer: Medicaid Other | Admitting: Physical Therapy

## 2020-05-29 ENCOUNTER — Other Ambulatory Visit: Payer: Self-pay

## 2020-05-29 DIAGNOSIS — R279 Unspecified lack of coordination: Secondary | ICD-10-CM

## 2020-05-29 DIAGNOSIS — M6281 Muscle weakness (generalized): Secondary | ICD-10-CM | POA: Diagnosis not present

## 2020-05-29 NOTE — Therapy (Signed)
[facility] [address], [address] [place], [place], [address] Phone: [phone]   Fax:  [phone]  Physical Therapy Treatment  Patient Details  Name: Rita Acosta MRN: [id number] Date of Birth: 04/23/1991 Referring Provider (PT): Sherian Rein, MD   Encounter Date: 05/29/2020   PT End of Session - 05/29/20 1531    Visit Number 6    Date for PT Re-Evaluation 07/18/20    PT Start Time 1448    PT Stop Time 1528    PT Time Calculation (min) 40 min    Activity Tolerance Patient tolerated treatment well    Behavior During Therapy [facility] for tasks assessed/performed           Past Medical History:  Diagnosis Date  . Anemia   . Infection    pneumonia as infant and malaria infection at 52 years old  . Preterm premature rupture of membranes (PPROM) with unknown onset of labor 08/31/2017  . SVD (spontaneous vaginal delivery) 12/11/2019    Past Surgical History:  Procedure Laterality Date  . NO PAST SURGERIES      There were no vitals filed for this visit.   Subjective Assessment - 05/29/20 1535    Subjective Spouse to interpret; questions about breathing and has trouble emptying the bladder at night.    Patient Stated Goals pt wants to know how to do exercises at home    Currently in Pain? No/denies                             [facility] Adult PT Treatment/Exercise - 05/29/20 0001      Neuro Re-ed    Neuro Re-ed Details  abdominal bracing with exhale to get better transversus ab activation      Lumbar Exercises: Supine   Straight Leg Raise 10 reps    Straight Leg Raises Limitations with and without band for Transversus abdominus    Large Ball Oblique Isometric 20 reps    Large Ball Oblique Isometric Limitations different mods including ball press, ball overhead, red band ext    Other Supine Lumbar Exercises bent knee fall out; clam with green loop    Other Supine Lumbar Exercises attempt bridge  but tightens more in the lumbar so deferred for now                  PT Education - 05/29/20 1525    Education Details Access Code: [employer]    Person(s) Educated Patient;Spouse    Methods Explanation;Demonstration;Tactile cues;Verbal cues;Handout    Comprehension Verbalized understanding;Returned demonstration               PT Long Term Goals - 05/23/20 0912      PT LONG TERM GOAL #1   Title pt will be ind with HEP    Baseline still working on more basic exercises    Time 8    Period Weeks    Status On-going    Target Date 07/18/20      PT LONG TERM GOAL #2   Title Pt will report 50% less prolapse    Baseline not noticeably different; still feel it every time she uses the bathroom    Time 8    Period Weeks    Status New    Target Date 07/18/20      PT LONG TERM GOAL #3   Title Pt will report 50% less discomfort with intercourse    Baseline  a little discomfort, able to have intercourse    Time 8    Period Weeks    Status On-going    Target Date 07/18/20      PT LONG TERM GOAL #4   Title Pt will be able to sustain pelvic floor contraction for at least 20 seconds for reduction of prolapse    Baseline 8 seconds    Time 8    Period Weeks    Status On-going    Target Date 07/18/20                 Plan - 05/29/20 1532    Clinical Impression Statement Pt was doing much better with pelvic floor strength.  She reports she has recently been having more difficulty emptying her bladder.  Pt was educated in doing less of the kegel exercises and more stretching and breathing especially at night.  Pt also has a hard time engaging her deep core muscles without holding her breath or bulging the abdomen. She did well with exercises with cues to exhale and palpation of transverse abs.  Pt will benefit from skilled PT to continue to progress strength and enduance for functional activities without pain or increased prolapse.    PT Treatment/Interventions ADLs/Self  Care Home Management;Biofeedback;Cryotherapy;Electrical Stimulation;Moist Heat;Therapeutic exercise;Neuromuscular re-education;Therapeutic activities;Patient/family education;Manual techniques;Dry needling    PT Next Visit Plan f/u on urinary difficulty, core strength in quadruped; child pose, internal STM if still having pain and to reassess endurance    PT Home Exercise Plan Access Code: [email or web address]; self stretch    Consulted and Agree with Plan of Care Patient;Family member/caregiver    Family Member Consulted husband to interpret           Patient will benefit from skilled therapeutic intervention in order to improve the following deficits and impairments:  Increased muscle spasms,Postural dysfunction,Decreased strength,Decreased coordination  Visit Diagnosis: Muscle weakness (generalized)  Unspecified lack of coordination     Problem List Patient Active Problem List   Diagnosis Date Noted  . COVID-19 04/18/2020  . Normal pregnancy, third trimester 12/11/2019  . SVD (spontaneous vaginal delivery) 12/11/2019  . History of pregnancy loss, not currently pregnant 10/20/2017    Junious Silk, PT 05/29/2020, 4:54 PM  [facility] [address] [place], [place], [address] Phone: [phone]   Fax:  [phone]  Name: Rita Acosta MRN: [id number] Date of Birth: 06-01-1990

## 2020-05-29 NOTE — Patient Instructions (Signed)
Access Code: [email or web address] URL: [email or web address] Date: 05/29/2020 Prepared by: Dwana Curd  Exercises Quadruped Alternating Arm Lift - 1 x daily - 7 x weekly - 2 sets - 10 reps Mini Squat - 1 x daily - 7 x weekly - 3 sets - 10 reps Supine Butterfly Groin Stretch - 1 x daily - 7 x weekly - 1 sets - 3 reps - 30 sec hold Child's Pose with Sidebending - 1 x daily - 7 x weekly - 1 sets - 3 reps - 30 sec hold Supine Piriformis Stretch with Foot on Ground - 1 x daily - 7 x weekly - 3 reps - 1 sets - 30 sec hold Supine Piriformis Stretch - 1 x daily - 7 x weekly - 3 reps - 1 sets - 30 sec hold Supine Pelvic Floor Stretch - 1 x daily - 7 x weekly - 3 reps - 1 sets - 30 sec hold Supine Diaphragmatic Breathing - 3 x daily - 7 x weekly - 10 reps - 1 sets Sit to stand pelvic - blow as you go - 3 x daily - 7 x weekly - 1 sets - 10 reps Supine Bridge with Mini Swiss Ball Between Knees - 1 x daily - 7 x weekly - 1 sets - 10 reps - 5 sec; rest 5 sec hold Supine Bridge with Pelvic Floor Contraction - 1 x daily - 7 x weekly - 1 sets - 10 reps - 5 sec; rest 5 sec hold Child's Pose Stretch - 1 x daily - 7 x weekly - 1 sets - 3 reps - 30-60 hold Hooklying Clamshells with Resistance - 1 x daily - 7 x weekly - 1 sets - 10 reps Supine ASLR with Ab Bracing and Shoulder Extension with Anchored Resistance - 1 x daily - 7 x weekly - 3 sets - 10 reps

## 2020-06-04 DIAGNOSIS — Z419 Encounter for procedure for purposes other than remedying health state, unspecified: Secondary | ICD-10-CM | POA: Diagnosis not present

## 2020-06-14 DIAGNOSIS — Z20822 Contact with and (suspected) exposure to covid-19: Secondary | ICD-10-CM | POA: Diagnosis not present

## 2020-06-18 DIAGNOSIS — J069 Acute upper respiratory infection, unspecified: Secondary | ICD-10-CM | POA: Diagnosis not present

## 2020-06-20 ENCOUNTER — Ambulatory Visit: Payer: Medicaid Other | Attending: Obstetrics and Gynecology | Admitting: Physical Therapy

## 2020-06-20 ENCOUNTER — Encounter: Payer: Self-pay | Admitting: Physical Therapy

## 2020-06-20 ENCOUNTER — Other Ambulatory Visit: Payer: Self-pay

## 2020-06-20 DIAGNOSIS — R279 Unspecified lack of coordination: Secondary | ICD-10-CM | POA: Insufficient documentation

## 2020-06-20 DIAGNOSIS — M6281 Muscle weakness (generalized): Secondary | ICD-10-CM | POA: Diagnosis not present

## 2020-06-20 NOTE — Therapy (Signed)
[facility] [address], [address] [place], [place], [address] Phone: [phone]   Fax:  [phone]  Physical Therapy Treatment  Patient Details  Name: Ogechi Kuehnel MRN: [id number] Date of Birth: [birth year] Referring Provider (PT): Sherian Rein, MD   Encounter Date: 06/20/2020   PT End of Session - 06/20/20 1022    Visit Number 7    Date for PT Re-Evaluation 07/18/20    PT Start Time 1015    PT Stop Time 1055    PT Time Calculation (min) 40 min    Activity Tolerance Patient tolerated treatment well    Behavior During Therapy [facility] for tasks assessed/performed           Past Medical History:  Diagnosis Date  . Anemia   . Infection    pneumonia as infant and malaria infection at 43 years old  . Preterm premature rupture of membranes (PPROM) with unknown onset of labor 08/31/2017  . SVD (spontaneous vaginal delivery) 12/11/2019    Past Surgical History:  Procedure Laterality Date  . NO PAST SURGERIES      There were no vitals filed for this visit.   Subjective Assessment - 06/20/20 1020    Subjective Pt states she has been tested for covid and negative but has been coughing a lot due to allergies.  Pt has had increased symptoms due to the coughing    Patient is accompained by: Family member    Patient Stated Goals pt wants to know how to do exercises at home    Currently in Pain? No/denies                             [facility] Adult PT Treatment/Exercise - 06/20/20 0001      Self-Care   Other Self-Care Comments  demo and performed self massage to pubococc and bulbo      Manual Therapy   Manual Therapy Internal Pelvic Floor    Manual therapy comments pt identity confirmed and internal soft tissue with pt consent - husband present to interpret    Internal Pelvic Floor pubococcygeus Rt side more than Lt; bulbocav Rt>Lt                  PT Education - 06/20/20 1100    Education  Details double  voiding and reviewed self massage and gave lub samples    Person(s) Educated Patient;Spouse    Methods Explanation;Demonstration;Tactile cues;Verbal cues;Handout    Comprehension Verbalized understanding;Returned demonstration               PT Long Term Goals - 05/23/20 0912      PT LONG TERM GOAL #1   Title pt will be ind with HEP    Baseline still working on more basic exercises    Time 8    Period Weeks    Status On-going    Target Date 07/18/20      PT LONG TERM GOAL #2   Title Pt will report 50% less prolapse    Baseline not noticeably different; still feel it every time she uses the bathroom    Time 8    Period Weeks    Status New    Target Date 07/18/20      PT LONG TERM GOAL #3   Title Pt will report 50% less discomfort with intercourse    Baseline a little discomfort, able to have intercourse    Time 8  Period Weeks    Status On-going    Target Date 07/18/20      PT LONG TERM GOAL #4   Title Pt will be able to sustain pelvic floor contraction for at least 20 seconds for reduction of prolapse    Baseline 8 seconds    Time 8    Period Weeks    Status On-going    Target Date 07/18/20                 Plan - 06/20/20 1101    Clinical Impression Statement Pt continues to have good strength in the pelvic floor.  Today's session focused more on education on how to relax the tonein the pelvic floor.  After a bout of coughing her muscles were much tighter.  She is also still reporting a little discomfort with intercourse.  This does not seem to bother her, but it is expected that this will resolve with improved muscle length of the pelvic floor  Pt will benefit from skilled PT to continue to work on core strenght and muscle coordination    PT Treatment/Interventions ADLs/Self Care Home Management;Biofeedback;Cryotherapy;Electrical Stimulation;Moist Heat;Therapeutic exercise;Neuromuscular re-education;Therapeutic activities;Patient/family  education;Manual techniques;Dry needling    PT Next Visit Plan f/u on urinary difficulty, core strength in quadruped; child pose, internal STM if still having pain and to reassess endurance    PT Home Exercise Plan Access Code: [email or web address]; self stretch    Consulted and Agree with Plan of Care Patient;Family member/caregiver           Patient will benefit from skilled therapeutic intervention in order to improve the following deficits and impairments:  Increased muscle spasms,Postural dysfunction,Decreased strength,Decreased coordination  Visit Diagnosis: Muscle weakness (generalized)  Unspecified lack of coordination     Problem List Patient Active Problem List   Diagnosis Date Noted  . COVID-19 04/18/2020  . Normal pregnancy, third trimester 12/11/2019  . SVD (spontaneous vaginal delivery) 12/11/2019  . History of pregnancy loss, not currently pregnant 10/20/2017    Junious Silk, PT 06/20/2020, 11:34 AM  [facility] [address] [place], [place], [address] Phone: [phone]   Fax:  [phone]  Name: Jozette Castrellon MRN: [id number] Date of Birth: 1990-06-04

## 2020-06-20 NOTE — Patient Instructions (Addendum)
Double Voiding can be a very useful technique to help overcome incomplete emptying of your bladder.  Incomplete emptying of urine can result in leakage after using the bathroom and increase the risk of urinary tract infection.   Initial Void: When you first sit down to urinate, ensure optimal positioning for bladder emptying by following these guidelines for toileting posture: Sit on the toilet seat - don't hover over the seat Support your trunk by placing your hands on your knees or thighs Spread your knees and hips wide Position your feet flat on the floor or elevate feet on phone books, foot stool (Squatty Potty), or wrapped toilet paper rolls (if having knees above hips helps you empty) Lean forward from your hips Maintain the normal inward curve in your lower back   Repeated Void: After your initial void is complete, follow these movement patterns and attempt going to the bathroom again. Stand up Rotate your hips as if doing hula hoop in one direction Rotate using the same action in the other direction Rock your hips and pelvis back and forwards ("pelvic tilts") Rock your hips and pelvis side to side ("tail wag") Sit back down and repeat your voiding technique This technique can be repeated as many times as you choose to help you empty your bladder more effectively.  [facility] [address] [place], [place] [address] Phone # [phone] Fax [phone]

## 2020-06-27 ENCOUNTER — Ambulatory Visit: Payer: Medicaid Other | Admitting: Physical Therapy

## 2020-06-27 ENCOUNTER — Encounter: Payer: Self-pay | Admitting: Physical Therapy

## 2020-06-27 ENCOUNTER — Other Ambulatory Visit: Payer: Self-pay

## 2020-06-27 DIAGNOSIS — R279 Unspecified lack of coordination: Secondary | ICD-10-CM | POA: Diagnosis not present

## 2020-06-27 DIAGNOSIS — M6281 Muscle weakness (generalized): Secondary | ICD-10-CM

## 2020-06-27 NOTE — Therapy (Signed)
[facility] [address] [place], [place], [address] Phone: [phone]   Fax:  [phone]  Physical Therapy Treatment  Patient Details  Name: Rita Acosta MRN: [id number] Date of Birth: 05/27/1990 Referring Provider (PT): Sherian Rein, MD   Encounter Date: 06/27/2020   PT End of Session - 06/27/20 1100    Visit Number 8    Date for PT Re-Evaluation 07/18/20    PT Start Time 1020    PT Stop Time 1100    PT Time Calculation (min) 40 min    Activity Tolerance Patient tolerated treatment well    Behavior During Therapy [facility] for tasks assessed/performed           Past Medical History:  Diagnosis Date  . Anemia   . Infection    pneumonia as infant and malaria infection at 73 years old  . Preterm premature rupture of membranes (PPROM) with unknown onset of labor 08/31/2017  . SVD (spontaneous vaginal delivery) 12/11/2019    Past Surgical History:  Procedure Laterality Date  . NO PAST SURGERIES      There were no vitals filed for this visit.   Subjective Assessment - 06/27/20 1101    Subjective Feeling the same, hard to empty bladder    Patient is accompained by: Family member   husband interpreting   Patient Stated Goals pt wants to know how to do exercises at home    Currently in Pain? No/denies                             [facility] Adult PT Treatment/Exercise - 06/27/20 0001      Self-Care   Self-Care Other Self-Care Comments    Other Self-Care Comments  toileting techniques      Neuro Re-ed    Neuro Re-ed Details  abdominal bracing with TC cues throughout, pelvic flor contract and relax at intended times; TC for bulging      Lumbar Exercises: Standing   Other Standing Lumbar Exercises shoulder ext with yellow band for core activation    Other Standing Lumbar Exercises supine: bridges with and without ball - 20x                  PT Education - 06/27/20 1059     Education Details toileting techniques and shoulder ext    Person(s) Educated Patient;Spouse    Methods Explanation;Demonstration;Verbal cues;Handout    Comprehension Verbalized understanding;Returned demonstration               PT Long Term Goals - 05/23/20 0912      PT LONG TERM GOAL #1   Title pt will be ind with HEP    Baseline still working on more basic exercises    Time 8    Period Weeks    Status On-going    Target Date 07/18/20      PT LONG TERM GOAL #2   Title Pt will report 50% less prolapse    Baseline not noticeably different; still feel it every time she uses the bathroom    Time 8    Period Weeks    Status New    Target Date 07/18/20      PT LONG TERM GOAL #3   Title Pt will report 50% less discomfort with intercourse    Baseline a little discomfort, able to have intercourse    Time 8    Period Weeks  Status On-going    Target Date 07/18/20      PT LONG TERM GOAL #4   Title Pt will be able to sustain pelvic floor contraction for at least 20 seconds for reduction of prolapse    Baseline 8 seconds    Time 8    Period Weeks    Status On-going    Target Date 07/18/20                 Plan - 06/27/20 1101    Clinical Impression Statement Pt was educated on bulging in sitting for improved toileting and given education on how to improve bladder emptying.  pt needed verbal and tactile cues to review questions she has with HEP.  PRogressed to standing core strengthening today.  Pt will most likely d/c next visit    PT Treatment/Interventions ADLs/Self Care Home Management;Biofeedback;Cryotherapy;Electrical Stimulation;Moist Heat;Therapeutic exercise;Neuromuscular re-education;Therapeutic activities;Patient/family education;Manual techniques;Dry needling    PT Next Visit Plan goals and review HEP    PT Home Exercise Plan Access Code: [email or web address]; self stretch; toileting    Consulted and Agree with Plan of Care Patient;Family member/caregiver            Patient will benefit from skilled therapeutic intervention in order to improve the following deficits and impairments:  Increased muscle spasms,Postural dysfunction,Decreased strength,Decreased coordination  Visit Diagnosis: Muscle weakness (generalized)  Unspecified lack of coordination     Problem List Patient Active Problem List   Diagnosis Date Noted  . COVID-19 04/18/2020  . Normal pregnancy, third trimester 12/11/2019  . SVD (spontaneous vaginal delivery) 12/11/2019  . History of pregnancy loss, not currently pregnant 10/20/2017    Junious Silk, PT 06/27/2020, 11:05 AM  [facility] [address] [place], [place], [address] Phone: [phone]   Fax:  [phone]  Name: Rita Acosta MRN: [id number] Date of Birth: [birth year]

## 2020-06-27 NOTE — Patient Instructions (Addendum)
Toileting Techniques for Bowel Movements (Defecation) Using your belly (abdomen) and pelvic floor muscles to have a bowel movement is usually instinctive.  Sometimes people can have problems with these muscles and have to relearn proper defecation (emptying) techniques.  If you have weakness in your muscles, organs that are falling out, decreased sensation in your pelvis, or ignore your urge to go, you may find yourself straining to have a bowel movement.  You are straining if you are: . holding your breath or taking in a huge gulp of air and holding it  . keeping your lips and jaw tensed and closed tightly . turning red in the face because of excessive pushing or forcing . developing or worsening your  hemorrhoids . getting faint while pushing . not emptying completely and have to defecate many times a day  If you are straining, you are actually making it harder for yourself to have a bowel movement.  Many people find they are pulling up with the pelvic floor muscles and closing off instead of opening the anus. Due to lack pelvic floor relaxation and coordination the abdominal muscles, one has to work harder to push the feces out.  Many people have never been taught how to defecate efficiently and effectively.  Notice what happens to your body when you are having a bowel movement.  While you are sitting on the toilet pay attention to the following areas: . Jaw and mouth position . Angle of your hips   . Whether your feet touch the ground or not . Arm placement  . Spine position . Waist . Belly tension . Anus (opening of the anal canal)  An Evacuation/Defecation Plan   Here are the 4 basic points:  1. Lean forward enough for your elbows to rest on your knees 2. Support your feet on the floor or use a low stool if your feet don't touch the floor  3. Push out your belly as if you have swallowed a beach ball-you should feel a widening of your waist 4. Open and relax your pelvic floor muscles,  rather than tightening around the anus       The following conditions my require modifications to your toileting posture:  . If you have had surgery in the past that limits your back, hip, pelvic, knee or ankle flexibility . Constipation   Your healthcare practitioner may make the following additional suggestions and adjustments:  1) Sit on the toilet  a) Make sure your feet are supported. b) Notice your hip angle and spine position-most people find it effective to lean forward or raise their knees, which can help the muscles around the anus to relax  c) When you lean forward, place your forearms on your thighs for support  2) Relax suggestions a) Breath deeply in through your nose and out slowly through your mouth as if you are smelling the flowers and blowing out the candles. b) To become aware of how to relax your muscles, contracting and releasing muscles can be helpful.  Pull your pelvic floor muscles in tightly by using the image of holding back gas, or closing around the anus (visualize making a circle smaller) and lifting the anus up and in.  Then release the muscles and your anus should drop down and feel open. Repeat 5 times ending with the feeling of relaxation. c) Keep your pelvic floor muscles relaxed; let your belly bulge out. d) The digestive tract starts at the mouth and ends at the anal opening, so   be sure to relax both ends of the tube.  Place your tongue on the roof of your mouth with your teeth separated.  This helps relax your mouth and will help to relax the anus at the same time.  3) Empty (defecation) a) Keep your pelvic floor and sphincter relaxed, then bulge your anal muscles.  Make the anal opening wide.  b) Stick your belly out as if you have swallowed a beach ball. c) Make your belly wall hard using your belly muscles while continuing to breathe. Doing this makes it easier to open your anus. d) Breath out and give a grunt (or try using other sounds such as  ahhhh, shhhhh, ohhhh or grrrrrrr).  4) Finish a) As you finish your bowel movement, pull the pelvic floor muscles up and in.  This will leave your anus in the proper place rather than remaining pushed out and down. If you leave your anus pushed out and down, it will start to feel as though that is normal and give you incorrect signals about needing to have a bowel movement. [facility] [address], [address] [place], [place] [address] Phone # [phone] Fax [phone]  

## 2020-07-02 DIAGNOSIS — Z419 Encounter for procedure for purposes other than remedying health state, unspecified: Secondary | ICD-10-CM | POA: Diagnosis not present

## 2020-07-03 ENCOUNTER — Ambulatory Visit: Payer: Medicaid Other | Attending: Obstetrics and Gynecology | Admitting: Physical Therapy

## 2020-07-03 ENCOUNTER — Other Ambulatory Visit: Payer: Self-pay

## 2020-07-03 ENCOUNTER — Encounter: Payer: Self-pay | Admitting: Physical Therapy

## 2020-07-03 DIAGNOSIS — R279 Unspecified lack of coordination: Secondary | ICD-10-CM | POA: Diagnosis not present

## 2020-07-03 DIAGNOSIS — M6281 Muscle weakness (generalized): Secondary | ICD-10-CM | POA: Diagnosis not present

## 2020-07-03 NOTE — Therapy (Signed)
[facility] [address] [place], [place], [address] Phone: [phone]   Fax:  [phone]  Physical Therapy Treatment  Patient Details  Name: Rita Acosta MRN: [id number] Date of Birth: 10-25-1990 Referring Provider (PT): Janyth Contes, MD   Encounter Date: 07/03/2020   PT End of Session - 07/03/20 1535    Visit Number 9    Date for PT Re-Evaluation 07/18/20    PT Start Time 6962    PT Stop Time 1611    PT Time Calculation (min) 36 min    Activity Tolerance Patient tolerated treatment well    Behavior During Therapy [facility] for tasks assessed/performed           Past Medical History:  Diagnosis Date  . Anemia   . Infection    pneumonia as infant and malaria infection at 38 years old  . Preterm premature rupture of membranes (PPROM) with unknown onset of labor 08/31/2017  . SVD (spontaneous vaginal delivery) 12/11/2019    Past Surgical History:  Procedure Laterality Date  . NO PAST SURGERIES      There were no vitals filed for this visit.   Subjective Assessment - 07/03/20 1615    Subjective Pt states she is the same since last time.  Not sure about the bridge and the squat exercise                             [facility] Adult PT Treatment/Exercise - 07/03/20 0001      Lumbar Exercises: Standing   Other Standing Lumbar Exercises squat technique, modified plank/qped core and pelvic floor engaged, bridge; shoulder ext with green band                       PT Long Term Goals - 07/03/20 1547      PT LONG TERM GOAL #1   Title pt will be ind with HEP    Status Achieved      PT LONG TERM GOAL #2   Title Pt will report 50% less prolapse    Baseline 5% better, only feels it after urinating    Status Not Met      PT LONG TERM GOAL #3   Title Pt will report 50% less discomfort with intercourse    Baseline 20% better    Status Not Met      PT LONG TERM GOAL #4   Title  Pt will be able to sustain pelvic floor contraction for at least 20 seconds for reduction of prolapse    Baseline 8 seconds    Status Not Met                 Plan - 07/03/20 1536    Clinical Impression Statement Todays session focused on ensuring successful discharge with HEP.  Recommending to d/c with HEP able to perform exercises and gave progressions and green band to continue strengthening at home    PT Treatment/Interventions ADLs/Self Care Home Management;Biofeedback;Cryotherapy;Electrical Stimulation;Moist Heat;Therapeutic exercise;Neuromuscular re-education;Therapeutic activities;Patient/family education;Manual techniques;Dry needling    PT Next Visit Plan d/c    Consulted and Agree with Plan of Care Patient;Family member/caregiver    Family Member Consulted husband to interpret           Patient will benefit from skilled therapeutic intervention in order to improve the following deficits and impairments:  Increased muscle spasms,Postural dysfunction,Decreased strength,Decreased coordination  Visit Diagnosis: Muscle weakness (  generalized)  Unspecified lack of coordination     Problem List Patient Active Problem List   Diagnosis Date Noted  . COVID-19 04/18/2020  . Normal pregnancy, third trimester 12/11/2019  . SVD (spontaneous vaginal delivery) 12/11/2019  . History of pregnancy loss, not currently pregnant 10/20/2017    Jule Ser, PT 07/03/2020, 5:36 PM  [facility] [address] [place], [place], [address] Phone: [phone]   Fax:  [phone]  Name: Rita Acosta MRN: [id number] Date of Birth: Jan 01, 1991  PHYSICAL THERAPY DISCHARGE SUMMARY  Visits from Start of Care: 9  Current functional level related to goals / functional outcomes: See above goals   Remaining deficits: See Jethro Poling   Education / Equipment: HEP Plan: Patient agrees to discharge.  Patient goals were  partially met. Patient is being discharged due to lack of progress.  ?????     Pt was making very slow progress but has the HEP with knowledge of how to progress at this time.  Gustavus Bryant, PT 07/03/20 5:36 PM

## 2020-08-02 DIAGNOSIS — Z419 Encounter for procedure for purposes other than remedying health state, unspecified: Secondary | ICD-10-CM | POA: Diagnosis not present

## 2020-08-22 DIAGNOSIS — N8189 Other female genital prolapse: Secondary | ICD-10-CM | POA: Diagnosis not present

## 2020-08-22 DIAGNOSIS — R102 Pelvic and perineal pain: Secondary | ICD-10-CM | POA: Diagnosis not present

## 2020-08-22 DIAGNOSIS — Z1389 Encounter for screening for other disorder: Secondary | ICD-10-CM | POA: Diagnosis not present

## 2020-08-22 DIAGNOSIS — R339 Retention of urine, unspecified: Secondary | ICD-10-CM | POA: Diagnosis not present

## 2020-08-24 DIAGNOSIS — Z20822 Contact with and (suspected) exposure to covid-19: Secondary | ICD-10-CM | POA: Diagnosis not present

## 2020-08-25 DIAGNOSIS — Z20822 Contact with and (suspected) exposure to covid-19: Secondary | ICD-10-CM | POA: Diagnosis not present

## 2020-09-01 DIAGNOSIS — Z419 Encounter for procedure for purposes other than remedying health state, unspecified: Secondary | ICD-10-CM | POA: Diagnosis not present

## 2020-10-02 DIAGNOSIS — Z419 Encounter for procedure for purposes other than remedying health state, unspecified: Secondary | ICD-10-CM | POA: Diagnosis not present

## 2020-11-01 DIAGNOSIS — Z419 Encounter for procedure for purposes other than remedying health state, unspecified: Secondary | ICD-10-CM | POA: Diagnosis not present

## 2020-11-11 DIAGNOSIS — L309 Dermatitis, unspecified: Secondary | ICD-10-CM | POA: Diagnosis not present

## 2020-11-24 DIAGNOSIS — Z8616 Personal history of COVID-19: Secondary | ICD-10-CM | POA: Insufficient documentation

## 2020-11-24 DIAGNOSIS — R1031 Right lower quadrant pain: Secondary | ICD-10-CM | POA: Insufficient documentation

## 2020-11-24 DIAGNOSIS — N939 Abnormal uterine and vaginal bleeding, unspecified: Secondary | ICD-10-CM | POA: Diagnosis not present

## 2020-11-24 DIAGNOSIS — N938 Other specified abnormal uterine and vaginal bleeding: Secondary | ICD-10-CM | POA: Insufficient documentation

## 2020-11-24 DIAGNOSIS — N9489 Other specified conditions associated with female genital organs and menstrual cycle: Secondary | ICD-10-CM | POA: Diagnosis not present

## 2020-11-24 DIAGNOSIS — R5383 Other fatigue: Secondary | ICD-10-CM | POA: Diagnosis not present

## 2020-11-24 DIAGNOSIS — R42 Dizziness and giddiness: Secondary | ICD-10-CM | POA: Insufficient documentation

## 2020-11-24 DIAGNOSIS — D259 Leiomyoma of uterus, unspecified: Secondary | ICD-10-CM | POA: Diagnosis not present

## 2020-11-24 DIAGNOSIS — R102 Pelvic and perineal pain: Secondary | ICD-10-CM | POA: Diagnosis not present

## 2020-11-24 DIAGNOSIS — R531 Weakness: Secondary | ICD-10-CM | POA: Insufficient documentation

## 2020-11-25 ENCOUNTER — Emergency Department (HOSPITAL_COMMUNITY): Payer: Medicaid Other

## 2020-11-25 ENCOUNTER — Emergency Department (HOSPITAL_COMMUNITY)
Admission: EM | Admit: 2020-11-25 | Discharge: 2020-11-25 | Disposition: A | Discharge status: Home / Self Care | Payer: Medicaid Other | Attending: Emergency Medicine | Admitting: Emergency Medicine

## 2020-11-25 ENCOUNTER — Encounter (HOSPITAL_COMMUNITY): Payer: Self-pay | Admitting: *Deleted

## 2020-11-25 DIAGNOSIS — Z113 Encounter for screening for infections with a predominantly sexual mode of transmission: Secondary | ICD-10-CM | POA: Diagnosis not present

## 2020-11-25 DIAGNOSIS — N939 Abnormal uterine and vaginal bleeding, unspecified: Secondary | ICD-10-CM | POA: Diagnosis not present

## 2020-11-25 DIAGNOSIS — Z01419 Encounter for gynecological examination (general) (routine) without abnormal findings: Secondary | ICD-10-CM | POA: Diagnosis not present

## 2020-11-25 DIAGNOSIS — R87610 Atypical squamous cells of undetermined significance on cytologic smear of cervix (ASC-US): Secondary | ICD-10-CM | POA: Diagnosis not present

## 2020-11-25 DIAGNOSIS — D259 Leiomyoma of uterus, unspecified: Secondary | ICD-10-CM | POA: Diagnosis not present

## 2020-11-25 DIAGNOSIS — N938 Other specified abnormal uterine and vaginal bleeding: Secondary | ICD-10-CM

## 2020-11-25 DIAGNOSIS — Z13220 Encounter for screening for lipoid disorders: Secondary | ICD-10-CM | POA: Diagnosis not present

## 2020-11-25 DIAGNOSIS — Z0000 Encounter for general adult medical examination without abnormal findings: Secondary | ICD-10-CM | POA: Diagnosis not present

## 2020-11-25 DIAGNOSIS — Z124 Encounter for screening for malignant neoplasm of cervix: Secondary | ICD-10-CM | POA: Diagnosis not present

## 2020-11-25 LAB — URINALYSIS, ROUTINE W REFLEX MICROSCOPIC
Bacteria, UA: NONE SEEN
Bilirubin Urine: NEGATIVE
Glucose, UA: NEGATIVE mg/dL
Ketones, ur: NEGATIVE mg/dL
Leukocytes,Ua: NEGATIVE
Nitrite: NEGATIVE
Protein, ur: 100 mg/dL — AB
RBC / HPF: >50 RBC/hpf — ABNORMAL HIGH (ref 0–5)
Specific Gravity, Urine: 1.009 (ref 1.005–1.030)
pH: 6 (ref 5.0–8.0)

## 2020-11-25 LAB — I-STAT BETA HCG BLOOD, ED (MC, WL, AP ONLY): I-stat hCG, quantitative: <5 m[IU]/mL (ref <5)

## 2020-11-25 LAB — GC/CHLAMYDIA PROBE AMP (~~LOC~~) NOT AT ARMC
Chlamydia: NEGATIVE
Comment: NEGATIVE
Comment: NORMAL
Neisseria Gonorrhea: NEGATIVE

## 2020-11-25 LAB — WET PREP, GENITAL
Clue Cells Wet Prep HPF POC: NONE SEEN
Sperm: NONE SEEN
Trich, Wet Prep: NONE SEEN
Yeast Wet Prep HPF POC: NONE SEEN

## 2020-11-25 LAB — CBC
HCT: 35.7 % — ABNORMAL LOW (ref 36.0–46.0)
Hemoglobin: 11.4 g/dL — ABNORMAL LOW (ref 12.0–15.0)
MCH: 25.2 pg — ABNORMAL LOW (ref 26.0–34.0)
MCHC: 31.9 g/dL (ref 30.0–36.0)
MCV: 79 fL — ABNORMAL LOW (ref 80.0–100.0)
Platelets: 225 10*3/uL (ref 150–400)
RBC: 4.52 MIL/uL (ref 3.87–5.11)
RDW: 14.1 % (ref 11.5–15.5)
WBC: 9.9 10*3/uL (ref 4.0–10.5)
nRBC: 0 % (ref 0.0–0.2)

## 2020-11-25 LAB — COMPREHENSIVE METABOLIC PANEL
ALT: 25 U/L (ref 0–44)
AST: 22 U/L (ref 15–41)
Albumin: 4 g/dL (ref 3.5–5.0)
Alkaline Phosphatase: 75 U/L (ref 38–126)
Anion gap: 10 (ref 5–15)
BUN: 11 mg/dL (ref 6–20)
CO2: 25 mmol/L (ref 22–32)
Calcium: 9.6 mg/dL (ref 8.9–10.3)
Chloride: 101 mmol/L (ref 98–111)
Creatinine, Ser: 0.73 mg/dL (ref 0.44–1.00)
GFR, Estimated: >60 mL/min (ref >60)
Glucose, Bld: 107 mg/dL — ABNORMAL HIGH (ref 70–99)
Potassium: 3.8 mmol/L (ref 3.5–5.1)
Sodium: 136 mmol/L (ref 135–145)
Total Bilirubin: 0.3 mg/dL (ref 0.3–1.2)
Total Protein: 7.2 g/dL (ref 6.5–8.1)

## 2020-11-25 MED ORDER — MEGESTROL ACETATE 40 MG PO TABS
40.0000 mg | ORAL_TABLET | Freq: Once | ORAL | Status: AC
  Administered 2020-11-25: 40 mg via ORAL
  Filled 2020-11-25: qty 1

## 2020-11-25 MED ORDER — SODIUM CHLORIDE 0.9 % IV BOLUS
1000.0000 mL | Freq: Once | INTRAVENOUS | Status: AC
  Administered 2020-11-25: 1000 mL via INTRAVENOUS

## 2020-11-25 NOTE — ED Notes (Signed)
Pt in US

## 2020-11-25 NOTE — Discharge Instructions (Addendum)
Call Dr. Ellyn Hack this morning for an appointment today or tomorrow.  You were given a one-time dose of medication tonight to slow your bleeding down. Dr. Ellyn Hack may wish to continue this or a different medication.  Return to the ED with worsening pain, bleeding, dizziness, lightheadedness, passing out, any other concerns.

## 2020-11-25 NOTE — ED Triage Notes (Addendum)
Pt reports having vaginal bleeding for the past 2 months, worse over the past 6 days. Reports initially R lower abd pain that is now radiating into R lower back. 15-20 pads in 2 days with large clots. Pt recently started on Norethindrone 0.35mg   to help decrease bleeding, however bleeding hs increased

## 2020-11-25 NOTE — ED Provider Notes (Signed)
[facility] EMERGENCY DEPARTMENT Provider Note   CSN: [id number] Arrival date & time: 11/24/20  2340     History Chief Complaint  Patient presents with   Vaginal Bleeding    Rita Acosta is a 30 y.o. female.  Patient with a history of irregular vaginal bleeding here with vaginal bleeding since May 30.  States after she gave birth 11 months ago she did not have a period for 7 or 8 months.  Since then she has been bleeding every day since May 30.  It is gotten much worse over the past 2 or 3 days.  Patient and husband report she is bleeding 15-20 pads per day with bright red blood and large clots.  She feels generally weak and lightheaded and dizzy.  Her OB doctor recently started her on birth control 6 days ago but they have not noticed any improvement yet.  They come in tonight because she is having increased bleeding with dizziness and generalized weakness.  Right-sided lower abdominal pain has been constant for the past 2 months and unchanged today. No fevers, chills, nausea or vomiting.  No chest pain or shortness of breath.  Feels generally weak and lightheaded. No blood thinner use. No focal weakness, numbness or tingling.  The history is provided by the patient and the spouse. The history is limited by a language barrier.  Vaginal Bleeding Associated symptoms: dizziness and fatigue   Associated symptoms: no abdominal pain, no dysuria, no fever and no nausea       Past Medical History:  Diagnosis Date   Anemia    Infection    pneumonia as infant and malaria infection at 7 years old   Preterm premature rupture of membranes (PPROM) with unknown onset of labor 08/31/2017   SVD (spontaneous vaginal delivery) 12/11/2019    Patient Active Problem List   Diagnosis Date Noted   COVID-19 04/18/2020   Normal pregnancy, third trimester 12/11/2019   SVD (spontaneous vaginal delivery) 12/11/2019   History of pregnancy loss, not currently pregnant 10/20/2017     Past Surgical History:  Procedure Laterality Date   NO PAST SURGERIES       OB History     Gravida  2   Para  2   Term  1   Preterm      AB      Living  1      SAB      IAB      Ectopic      Multiple  0   Live Births  1           Family History  Problem Relation Age of Onset   Hypertension Mother    Diabetes Father     Social History   Tobacco Use   Smoking status: Never   Smokeless tobacco: Never  Substance Use Topics   Alcohol use: No   Drug use: No    Home Medications Prior to Admission medications   Medication Sig Start Date End Date Taking? Authorizing Provider  acetaminophen (TYLENOL) 325 MG tablet Take 650 mg by mouth every 6 (six) hours as needed.    [provider]  albuterol (VENTOLIN HFA) 108 (90 Base) MCG/ACT inhaler Inhale 2 puffs into the lungs every 4 (four) hours as needed for up to 10 days for wheezing or shortness of breath. 04/18/20 04/28/20  Defelice, Para March, NP  docusate sodium (COLACE) 100 MG capsule Take 100 mg by mouth daily.    [provider]  ibuprofen (ADVIL) 800 MG tablet Take 1 tablet (800 mg total) by mouth every 8 (eight) hours as needed. 12/13/19   Shivaji, Valerie Roys, MD  iron polysaccharides (NIFEREX) 150 MG capsule Take 1 capsule (150 mg total) by mouth daily. 12/13/19   Shivaji, Valerie Roys, MD  pantoprazole (PROTONIX) 40 MG tablet Take 40 mg by mouth daily. 12/07/19   [provider]  Prenat w/o A Vit-FeFum-FePo-FA (CONCEPT OB) 130-92.4-1 MG CAPS Take 1 tablet by mouth daily. 06/08/17   Katrinka Blazing, [place], CNM    Allergies    Patient has no known allergies.  Review of Systems   Review of Systems  Constitutional:  Positive for fatigue. Negative for activity change, appetite change and fever.  HENT:  Negative for congestion.   Respiratory:  Negative for cough, chest tightness and shortness of breath.   Cardiovascular:  Negative for chest pain.  Gastrointestinal:  Negative for abdominal  pain, nausea and vomiting.  Genitourinary:  Positive for vaginal bleeding. Negative for dysuria and frequency.  Musculoskeletal:  Negative for arthralgias and myalgias.  Skin:  Negative for rash.  Neurological:  Positive for dizziness, weakness and light-headedness. Negative for headaches.   all other systems are negative except as noted in the HPI and PMH.   Physical Exam Updated Vital Signs BP 124/81 (BP Location: Left Arm)   Pulse 94   Temp 98.4 F (36.9 C) (Oral)   Resp 18   LMP 09/30/2020   SpO2 98%   Physical Exam Vitals and nursing note reviewed.  Constitutional:      General: She is not in acute distress.    Appearance: She is well-developed.  HENT:     Head: Normocephalic and atraumatic.     Mouth/Throat:     Pharynx: No oropharyngeal exudate.  Eyes:     Conjunctiva/sclera: Conjunctivae normal.     Pupils: Pupils are equal, round, and reactive to light.  Neck:     Comments: No meningismus. Cardiovascular:     Rate and Rhythm: Normal rate and regular rhythm.     Heart sounds: Normal heart sounds. No murmur heard. Pulmonary:     Effort: Pulmonary effort is normal. No respiratory distress.     Breath sounds: Normal breath sounds.  Abdominal:     Palpations: Abdomen is soft.     Tenderness: There is abdominal tenderness. There is no guarding or rebound.     Comments: Mild right-sided lower abdominal tenderness, no guarding or rebound  Genitourinary:    Comments: Chaperone present Engineer, structural.  Normal external genitalia.  Dark blood in vaginal vault.  Cervix is closed.  No significant CMT.  No midline or adnexal tenderness.  No palpable masses. Musculoskeletal:        General: No tenderness. Normal range of motion.     Cervical back: Normal range of motion and neck supple.  Skin:    General: Skin is warm.  Neurological:     Mental Status: She is alert and oriented to person, place, and time.     Cranial Nerves: No cranial nerve deficit.     Motor: No abnormal  muscle tone.     Coordination: Coordination normal.     Comments:  5/5 strength throughout. CN 2-12 intact.Equal grip strength.   Psychiatric:        Behavior: Behavior normal.    ED Results / Procedures / Treatments   Labs (all labs ordered are listed, but only abnormal results are displayed) Labs Reviewed  WET PREP, GENITAL -  Abnormal; Notable for the following components:      Result Value   WBC, Wet Prep HPF POC FEW (*)    All other components within normal limits  COMPREHENSIVE METABOLIC PANEL - Abnormal; Notable for the following components:   Glucose, Bld 107 (*)    All other components within normal limits  CBC - Abnormal; Notable for the following components:   Hemoglobin 11.4 (*)    HCT 35.7 (*)    MCV 79.0 (*)    MCH 25.2 (*)    All other components within normal limits  URINALYSIS, ROUTINE W REFLEX MICROSCOPIC - Abnormal; Notable for the following components:   Color, Urine RED (*)    APPearance HAZY (*)    Hgb urine dipstick MODERATE (*)    Protein, ur 100 (*)    RBC / HPF >50 (*)    All other components within normal limits  I-STAT BETA HCG BLOOD, ED ([facility], [facility], [facility] ONLY)  GC/CHLAMYDIA PROBE AMP ([place]) NOT AT [facility]    EKG None  Radiology US Transvaginal Non-OB  Result Date: 11/25/2020 CLINICAL DATA:  Irregular bleeding and right adnexal pain EXAM: TRANSABDOMINAL AND TRANSVAGINAL ULTRASOUND OF PELVIS DOPPLER ULTRASOUND OF OVARIES TECHNIQUE: Both transabdominal and transvaginal ultrasound examinations of the pelvis were performed. Transabdominal technique was performed for global imaging of the pelvis including uterus, ovaries, adnexal regions, and pelvic cul-de-sac. It was necessary to proceed with endovaginal exam following the transabdominal exam to visualize the uterus and ovaries. Color and duplex Doppler ultrasound was utilized to evaluate blood flow to the ovaries. COMPARISON:  None. FINDINGS: Uterus Measurements: 7.5 x 4.5 x 5.5 cm = volume: 98 mL. Small  fibroid in the left uterine body measures 8 x 7 x 7 mm. Endometrium Thickness: 20 mm. Heterogeneous opacity within the endometrial cavity, possibly blood. Right ovary Measurements: 2.7 x 1.4 x 1.9 cm = volume: 3.5 mL. Hypoechoic focus in the right adnexa measures 1.6 x 1.5 x 1.3 cm. Left ovary Measurements: 2.9 x 1.6 x 1.7 cm = volume: 4.3 mL. Normal appearance/no adnexal mass. Pulsed Doppler evaluation of both ovaries demonstrates normal low-resistance arterial and venous waveforms. Other findings No abnormal free fluid. IMPRESSION: 1. Heterogeneous opacity within the endometrial cavity, possibly blood. 2. Hypoechoic focus in the right adnexa measures 1.6 x 1.5 x 1.3 cm. This may be a hemorrhagic cyst. 3. Normal blood flow to both ovaries. Electronically Signed   By: Deatra Robinson M.D.   On: 11/25/2020 03:25   US Pelvis Complete  Result Date: 11/25/2020 CLINICAL DATA:  Irregular bleeding and right adnexal pain EXAM: TRANSABDOMINAL AND TRANSVAGINAL ULTRASOUND OF PELVIS DOPPLER ULTRASOUND OF OVARIES TECHNIQUE: Both transabdominal and transvaginal ultrasound examinations of the pelvis were performed. Transabdominal technique was performed for global imaging of the pelvis including uterus, ovaries, adnexal regions, and pelvic cul-de-sac. It was necessary to proceed with endovaginal exam following the transabdominal exam to visualize the uterus and ovaries. Color and duplex Doppler ultrasound was utilized to evaluate blood flow to the ovaries. COMPARISON:  None. FINDINGS: Uterus Measurements: 7.5 x 4.5 x 5.5 cm = volume: 98 mL. Small fibroid in the left uterine body measures 8 x 7 x 7 mm. Endometrium Thickness: 20 mm. Heterogeneous opacity within the endometrial cavity, possibly blood. Right ovary Measurements: 2.7 x 1.4 x 1.9 cm = volume: 3.5 mL. Hypoechoic focus in the right adnexa measures 1.6 x 1.5 x 1.3 cm. Left ovary Measurements: 2.9 x 1.6 x 1.7 cm = volume: 4.3 mL. Normal appearance/no  adnexal mass. Pulsed  Doppler evaluation of both ovaries demonstrates normal low-resistance arterial and venous waveforms. Other findings No abnormal free fluid. IMPRESSION: 1. Heterogeneous opacity within the endometrial cavity, possibly blood. 2. Hypoechoic focus in the right adnexa measures 1.6 x 1.5 x 1.3 cm. This may be a hemorrhagic cyst. 3. Normal blood flow to both ovaries. Electronically Signed   By: Deatra RobinsonKevin  Herman M.D.   On: 11/25/2020 03:25   US Art/Ven Flow Abd Pelv Doppler  Result Date: 11/25/2020 CLINICAL DATA:  Irregular bleeding and right adnexal pain EXAM: TRANSABDOMINAL AND TRANSVAGINAL ULTRASOUND OF PELVIS DOPPLER ULTRASOUND OF OVARIES TECHNIQUE: Both transabdominal and transvaginal ultrasound examinations of the pelvis were performed. Transabdominal technique was performed for global imaging of the pelvis including uterus, ovaries, adnexal regions, and pelvic cul-de-sac. It was necessary to proceed with endovaginal exam following the transabdominal exam to visualize the uterus and ovaries. Color and duplex Doppler ultrasound was utilized to evaluate blood flow to the ovaries. COMPARISON:  None. FINDINGS: Uterus Measurements: 7.5 x 4.5 x 5.5 cm = volume: 98 mL. Small fibroid in the left uterine body measures 8 x 7 x 7 mm. Endometrium Thickness: 20 mm. Heterogeneous opacity within the endometrial cavity, possibly blood. Right ovary Measurements: 2.7 x 1.4 x 1.9 cm = volume: 3.5 mL. Hypoechoic focus in the right adnexa measures 1.6 x 1.5 x 1.3 cm. Left ovary Measurements: 2.9 x 1.6 x 1.7 cm = volume: 4.3 mL. Normal appearance/no adnexal mass. Pulsed Doppler evaluation of both ovaries demonstrates normal low-resistance arterial and venous waveforms. Other findings No abnormal free fluid. IMPRESSION: 1. Heterogeneous opacity within the endometrial cavity, possibly blood. 2. Hypoechoic focus in the right adnexa measures 1.6 x 1.5 x 1.3 cm. This may be a hemorrhagic cyst. 3. Normal blood flow to both ovaries.  Electronically Signed   By: Deatra RobinsonKevin  Herman M.D.   On: 11/25/2020 03:25    Procedures Procedures   Medications Ordered in ED Medications  sodium chloride 0.9 % bolus 1,000 mL (has no administration in time range)    ED Course  I have reviewed the triage vital signs and the nursing notes.  Pertinent labs & imaging results that were available during my care of the patient were reviewed by me and considered in my medical decision making (see chart for details).    MDM Rules/Calculators/A&P                          Irregular vaginal bleeding worse in the past 2 days.  Vitals are stable.  Abdomen soft without peritoneal signs  hCG is negative.  Hemoglobin is stable at 11.4  Ultrasound shows blood in the endometrial cavity as well as a right hemorrhagic cyst.  Normal blood flow to both ovaries.  Vital signs remained stable.  Discussed with Dr. Jackelyn KnifeMeisinger on-call for Dr. Ellyn HackBovard.  He is reassured that patient's vitals are stable and she is not significantly anemic.  He recommends one-time dose of Megace 40 mg the patient call in the office in the morning for further evaluation by Dr. Ellyn HackBovard.  No indication for emergent transfusion or admission tonight  Orthostatics remain negative.  Patient able to tolerate p.o. and ambulate without dizziness.  Discussed with patient and husband.  Advised to call Dr. Emeline DarlingBovard's office first thing in the morning for an appointment in the next several days. Dose of Megace given here tonight. Return precautions discussed including worsening bleeding, lightheadedness, syncope, chest pain, shortness of breath, or any  other concerns Final Clinical Impression(s) / ED Diagnoses Final diagnoses:  Dysfunctional uterine bleeding    Rx / DC Orders ED Discharge Orders     None        Ondrea Dow, Jeannett Senior, MD 11/25/20 [phone]

## 2020-11-26 DIAGNOSIS — Z3202 Encounter for pregnancy test, result negative: Secondary | ICD-10-CM | POA: Diagnosis not present

## 2020-11-26 DIAGNOSIS — N939 Abnormal uterine and vaginal bleeding, unspecified: Secondary | ICD-10-CM | POA: Diagnosis not present

## 2020-11-26 DIAGNOSIS — N920 Excessive and frequent menstruation with regular cycle: Secondary | ICD-10-CM | POA: Diagnosis not present

## 2020-11-26 DIAGNOSIS — R5383 Other fatigue: Secondary | ICD-10-CM | POA: Diagnosis not present

## 2020-12-02 DIAGNOSIS — Z419 Encounter for procedure for purposes other than remedying health state, unspecified: Secondary | ICD-10-CM | POA: Diagnosis not present

## 2020-12-06 DIAGNOSIS — N939 Abnormal uterine and vaginal bleeding, unspecified: Secondary | ICD-10-CM | POA: Diagnosis not present

## 2020-12-06 DIAGNOSIS — M545 Low back pain, unspecified: Secondary | ICD-10-CM | POA: Diagnosis not present

## 2020-12-06 DIAGNOSIS — N920 Excessive and frequent menstruation with regular cycle: Secondary | ICD-10-CM | POA: Diagnosis not present

## 2021-01-02 DIAGNOSIS — Z419 Encounter for procedure for purposes other than remedying health state, unspecified: Secondary | ICD-10-CM | POA: Diagnosis not present

## 2021-01-27 DIAGNOSIS — Z862 Personal history of diseases of the blood and blood-forming organs and certain disorders involving the immune mechanism: Secondary | ICD-10-CM | POA: Diagnosis not present

## 2021-01-27 DIAGNOSIS — N939 Abnormal uterine and vaginal bleeding, unspecified: Secondary | ICD-10-CM | POA: Diagnosis not present

## 2021-01-27 DIAGNOSIS — N8189 Other female genital prolapse: Secondary | ICD-10-CM | POA: Diagnosis not present

## 2021-02-01 DIAGNOSIS — Z419 Encounter for procedure for purposes other than remedying health state, unspecified: Secondary | ICD-10-CM | POA: Diagnosis not present

## 2021-03-04 DIAGNOSIS — Z419 Encounter for procedure for purposes other than remedying health state, unspecified: Secondary | ICD-10-CM | POA: Diagnosis not present

## 2021-04-03 DIAGNOSIS — Z419 Encounter for procedure for purposes other than remedying health state, unspecified: Secondary | ICD-10-CM | POA: Diagnosis not present

## 2021-05-01 DIAGNOSIS — Z7185 Encounter for immunization safety counseling: Secondary | ICD-10-CM | POA: Diagnosis not present

## 2021-05-01 DIAGNOSIS — R059 Cough, unspecified: Secondary | ICD-10-CM | POA: Diagnosis not present

## 2021-05-01 DIAGNOSIS — R0981 Nasal congestion: Secondary | ICD-10-CM | POA: Diagnosis not present

## 2021-05-01 DIAGNOSIS — U071 COVID-19: Secondary | ICD-10-CM | POA: Diagnosis not present

## 2021-05-04 DIAGNOSIS — Z419 Encounter for procedure for purposes other than remedying health state, unspecified: Secondary | ICD-10-CM | POA: Diagnosis not present

## 2021-06-04 DIAGNOSIS — Z419 Encounter for procedure for purposes other than remedying health state, unspecified: Secondary | ICD-10-CM | POA: Diagnosis not present

## 2021-07-02 DIAGNOSIS — Z419 Encounter for procedure for purposes other than remedying health state, unspecified: Secondary | ICD-10-CM | POA: Diagnosis not present

## 2021-08-02 DIAGNOSIS — Z419 Encounter for procedure for purposes other than remedying health state, unspecified: Secondary | ICD-10-CM | POA: Diagnosis not present

## 2021-09-01 DIAGNOSIS — Z419 Encounter for procedure for purposes other than remedying health state, unspecified: Secondary | ICD-10-CM | POA: Diagnosis not present

## 2021-10-02 DIAGNOSIS — Z419 Encounter for procedure for purposes other than remedying health state, unspecified: Secondary | ICD-10-CM | POA: Diagnosis not present

## 2021-11-01 DIAGNOSIS — Z419 Encounter for procedure for purposes other than remedying health state, unspecified: Secondary | ICD-10-CM | POA: Diagnosis not present

## 2022-10-30 IMAGING — US US PELVIS COMPLETE
1 series · 13 of 25 positions shown · non-contrast
Comparison: None.

CLINICAL DATA: Irregular bleeding and right adnexal pain

EXAM:
TRANSABDOMINAL AND TRANSVAGINAL ULTRASOUND OF PELVIS
DOPPLER ULTRASOUND OF OVARIES
TECHNIQUE: Both transabdominal and transvaginal ultrasound examinations of the
pelvis were performed. Transabdominal technique was performed for
global imaging of the pelvis including uterus, ovaries, adnexal
regions, and pelvic cul-de-sac.
It was necessary to proceed with endovaginal exam following the
transabdominal exam to visualize the uterus and ovaries. Color and
duplex Doppler ultrasound was utilized to evaluate blood flow to the
ovaries.

[Series 1: us pelvis (transabdominal only) · 13 of 115 slices shown]
[im 1/115]
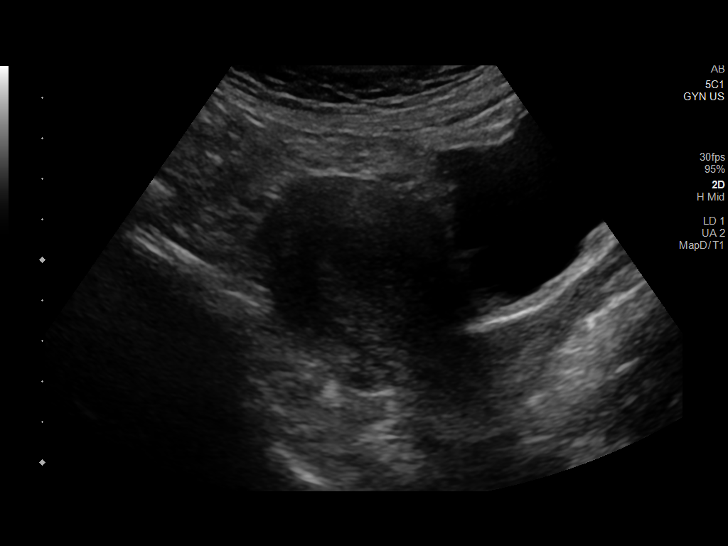
[im 10/115]
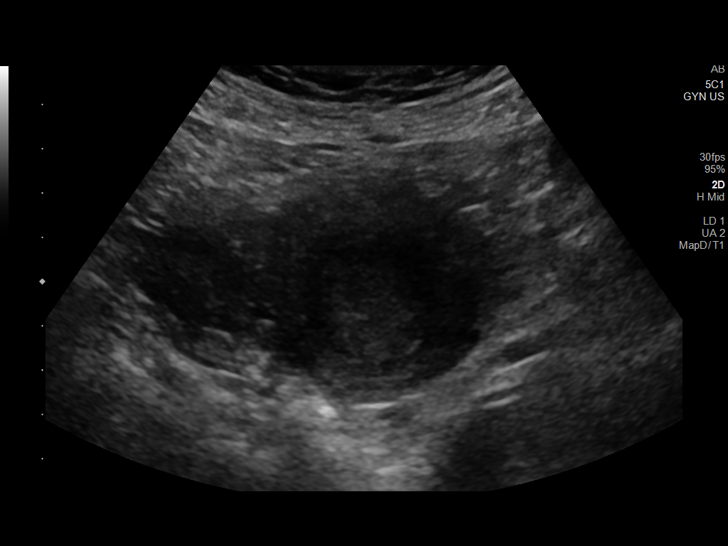
[im 20/115]
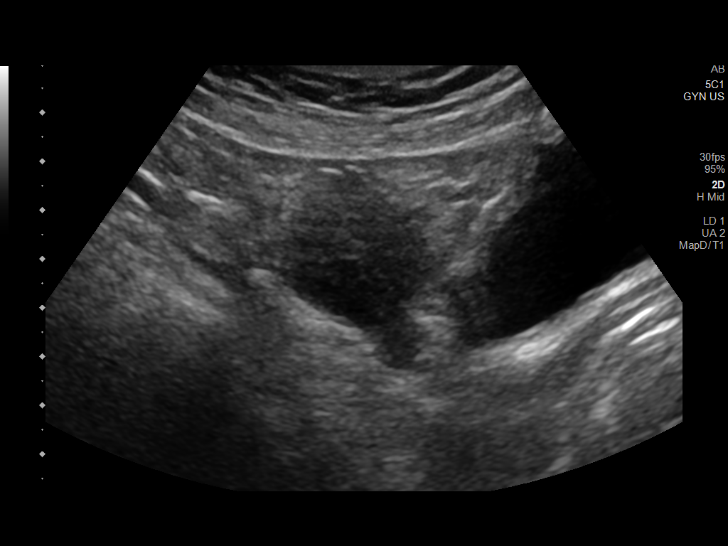
[im 29/115]
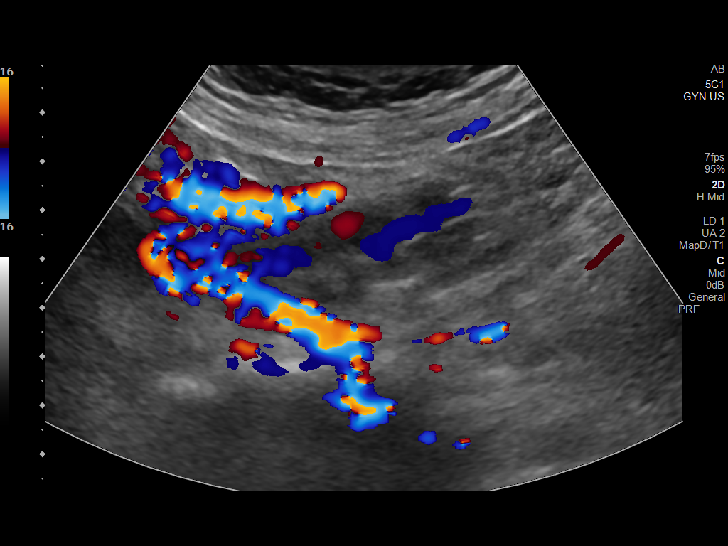
[im 39/115]
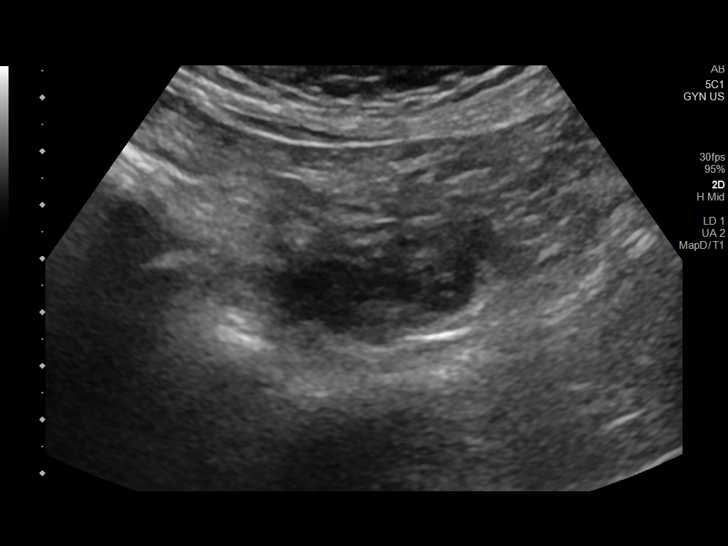
[im 48/115]
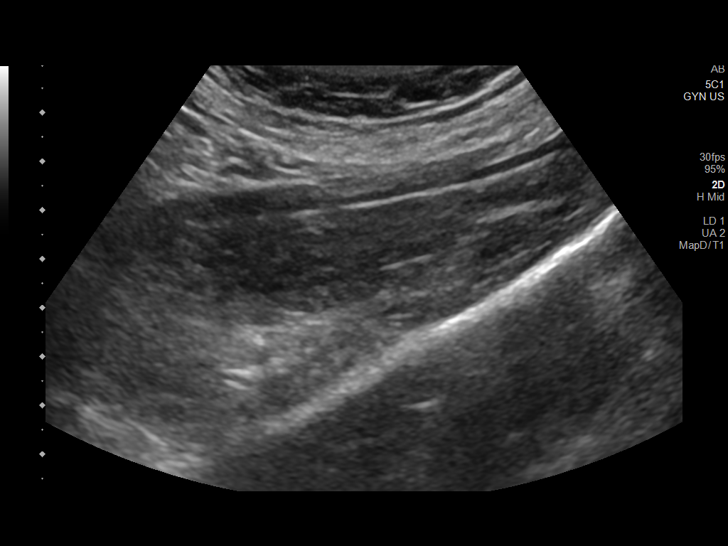
[im 58/115]
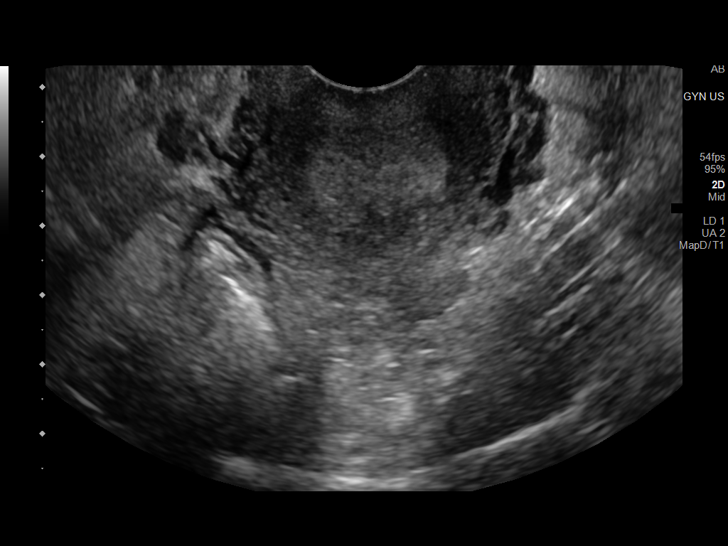
[im 67/115]
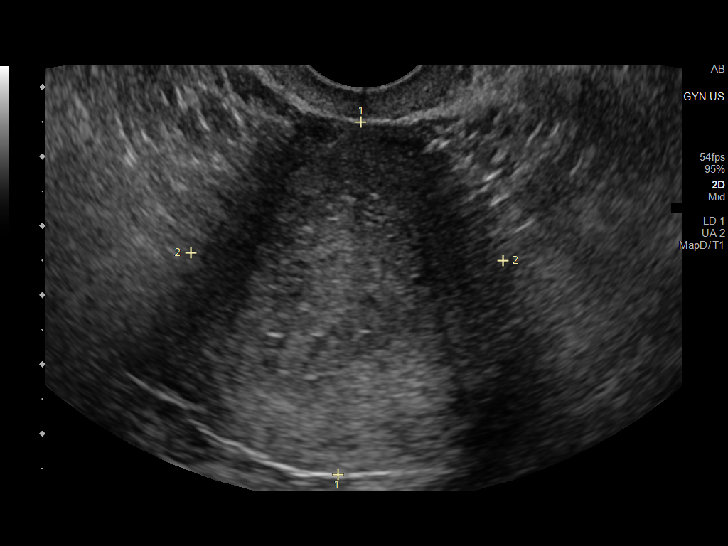
[im 77/115]
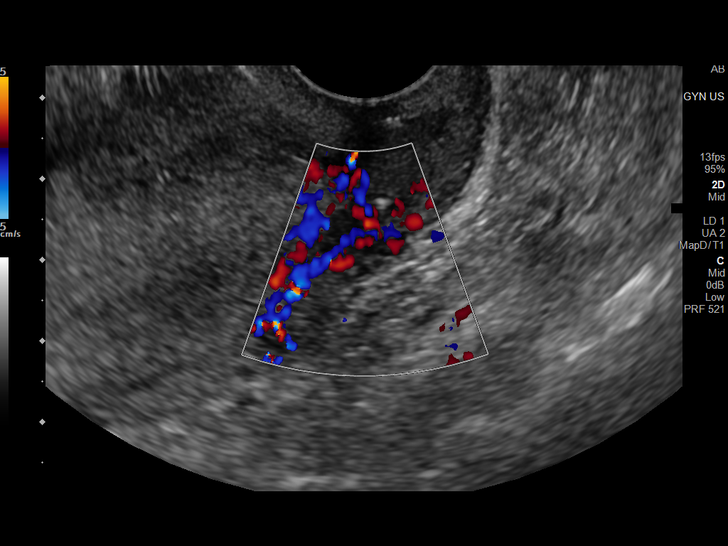
[im 86/115]
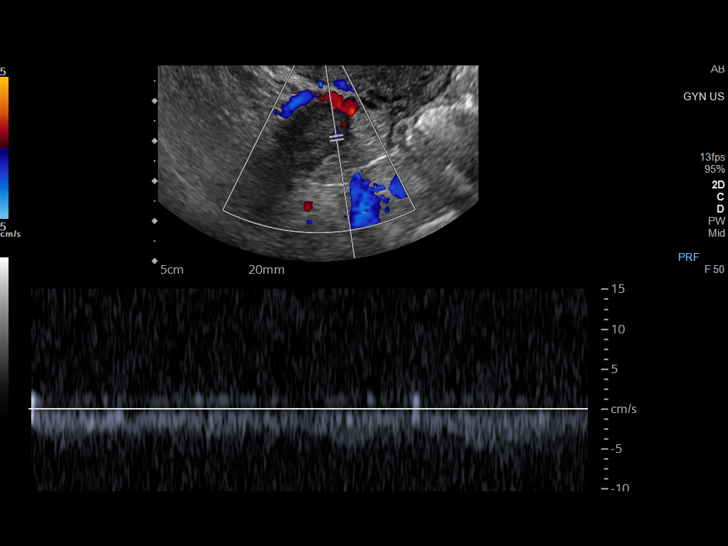
[im 96/115]
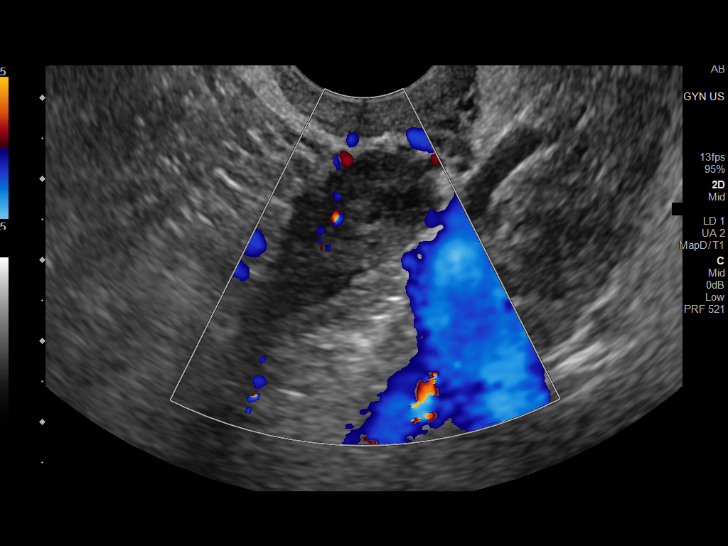
[im 105/115]
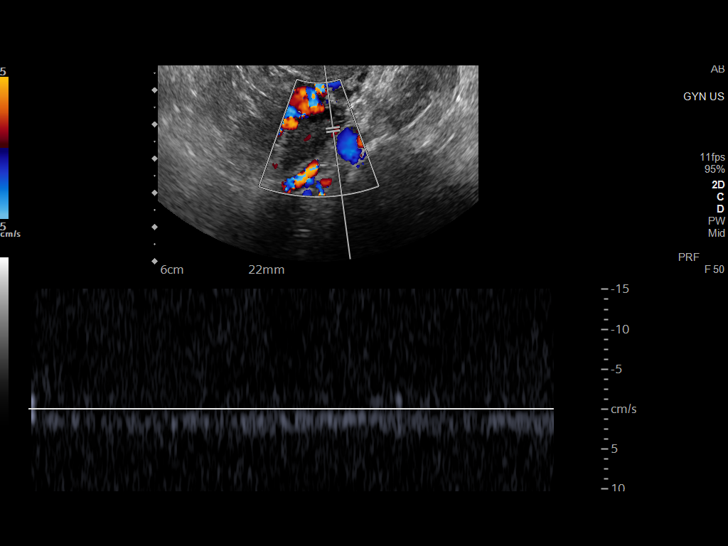
[im 115/115]
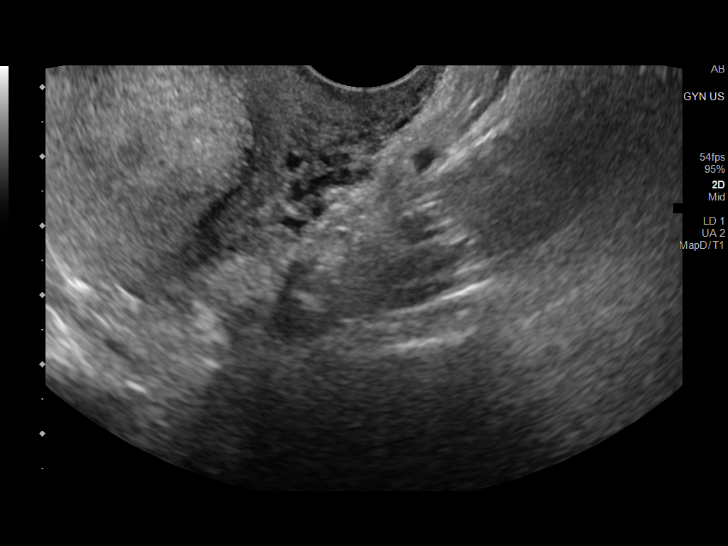

[13 of 25 positions shown; findings below may reference images not displayed]

FINDINGS: Uterus

Measurements: 7.5 x 4.5 x 5.5 cm = volume: 98 mL. Small fibroid in
the left uterine body measures 8 x 7 x 7 mm.

Endometrium

Thickness: 20 mm. Heterogeneous opacity within the endometrial
cavity, possibly blood.

Right ovary

Measurements: 2.7 x 1.4 x 1.9 cm = volume: 3.5 mL. Hypoechoic focus
in the right adnexa measures 1.6 x 1.5 x 1.3 cm.

Left ovary

Measurements: 2.9 x 1.6 x 1.7 cm = volume: 4.3 mL. Normal
appearance/no adnexal mass.

Pulsed Doppler evaluation of both ovaries demonstrates normal
low-resistance arterial and venous waveforms.

Other findings

No abnormal free fluid.
IMPRESSION: 1. Heterogeneous opacity within the endometrial cavity, possibly
blood.
2. Hypoechoic focus in the right adnexa measures 1.6 x 1.5 x 1.3 cm.
This may be a hemorrhagic cyst.
3. Normal blood flow to both ovaries.
